# Patient Record
Sex: Female | Born: 1955 | Race: White | Hispanic: No | Marital: Married | Smoking: Former smoker
Health system: Southern US, Community
[De-identification: ages and names within clinical notes are randomized; demographics above are authoritative.]

## PROBLEM LIST (undated history)

## (undated) DIAGNOSIS — F4310 Post-traumatic stress disorder, unspecified: Secondary | ICD-10-CM

## (undated) DIAGNOSIS — F32A Depression, unspecified: Secondary | ICD-10-CM

## (undated) DIAGNOSIS — K219 Gastro-esophageal reflux disease without esophagitis: Secondary | ICD-10-CM

## (undated) DIAGNOSIS — F329 Major depressive disorder, single episode, unspecified: Secondary | ICD-10-CM

## (undated) HISTORY — PX: OTHER SURGICAL HISTORY: SHX169

## (undated) HISTORY — DX: Post-traumatic stress disorder, unspecified: F43.10

---

## 2014-07-18 ENCOUNTER — Encounter: Payer: Self-pay | Admitting: Emergency Medicine

## 2014-07-18 ENCOUNTER — Emergency Department: Payer: PRIVATE HEALTH INSURANCE

## 2014-07-18 ENCOUNTER — Emergency Department
Admission: EM | Admit: 2014-07-18 | Discharge: 2014-07-18 | Disposition: A | Discharge status: Home / Self Care | Payer: PRIVATE HEALTH INSURANCE | Attending: Emergency Medicine | Admitting: Emergency Medicine

## 2014-07-18 DIAGNOSIS — Z87891 Personal history of nicotine dependence: Secondary | ICD-10-CM | POA: Insufficient documentation

## 2014-07-18 DIAGNOSIS — R748 Abnormal levels of other serum enzymes: Secondary | ICD-10-CM

## 2014-07-18 DIAGNOSIS — R7989 Other specified abnormal findings of blood chemistry: Secondary | ICD-10-CM | POA: Diagnosis not present

## 2014-07-18 DIAGNOSIS — B349 Viral infection, unspecified: Secondary | ICD-10-CM | POA: Diagnosis not present

## 2014-07-18 DIAGNOSIS — R509 Fever, unspecified: Secondary | ICD-10-CM | POA: Diagnosis present

## 2014-07-18 LAB — COMPREHENSIVE METABOLIC PANEL
ALBUMIN: 3.1 g/dL — AB (ref 3.5–5.0)
ALT: 260 U/L — ABNORMAL HIGH (ref 14–54)
ANION GAP: 9 (ref 5–15)
AST: 294 U/L — ABNORMAL HIGH (ref 15–41)
Alkaline Phosphatase: 411 U/L — ABNORMAL HIGH (ref 38–126)
BILIRUBIN TOTAL: 0.6 mg/dL (ref 0.3–1.2)
BUN: 9 mg/dL (ref 6–20)
CHLORIDE: 95 mmol/L — AB (ref 101–111)
CO2: 25 mmol/L (ref 22–32)
Calcium: 8.7 mg/dL — ABNORMAL LOW (ref 8.9–10.3)
Creatinine, Ser: 0.73 mg/dL (ref 0.44–1.00)
GFR calc non Af Amer: >60 mL/min (ref >60)
Glucose, Bld: 145 mg/dL — ABNORMAL HIGH (ref 65–99)
POTASSIUM: 3.7 mmol/L (ref 3.5–5.1)
SODIUM: 129 mmol/L — AB (ref 135–145)
Total Protein: 6.2 g/dL — ABNORMAL LOW (ref 6.5–8.1)

## 2014-07-18 LAB — PROTIME-INR
INR: 0.89
Prothrombin Time: 12.3 seconds (ref 11.4–15.0)

## 2014-07-18 LAB — CBC
HCT: 41.5 % (ref 35.0–47.0)
Hemoglobin: 14.1 g/dL (ref 12.0–16.0)
MCH: 31.5 pg (ref 26.0–34.0)
MCHC: 34.1 g/dL (ref 32.0–36.0)
MCV: 92.6 fL (ref 80.0–100.0)
PLATELETS: 56 10*3/uL — AB (ref 150–440)
RBC: 4.48 MIL/uL (ref 3.80–5.20)
RDW: 13.4 % (ref 11.5–14.5)
WBC: 5.6 10*3/uL (ref 3.6–11.0)

## 2014-07-18 LAB — URINALYSIS COMPLETE WITH MICROSCOPIC (ARMC ONLY)
BILIRUBIN URINE: NEGATIVE
Glucose, UA: NEGATIVE mg/dL
HGB URINE DIPSTICK: NEGATIVE
KETONES UR: NEGATIVE mg/dL
Leukocytes, UA: NEGATIVE
NITRITE: NEGATIVE
PROTEIN: 100 mg/dL — AB
SPECIFIC GRAVITY, URINE: 1.019 (ref 1.005–1.030)
pH: 5 (ref 5.0–8.0)

## 2014-07-18 LAB — LIPASE, BLOOD: LIPASE: 37 U/L (ref 22–51)

## 2014-07-18 LAB — TROPONIN I: Troponin I: <0.03 ng/mL (ref <0.031)

## 2014-07-18 MED ORDER — KETOROLAC TROMETHAMINE 30 MG/ML IJ SOLN
INTRAMUSCULAR | Status: AC
  Filled 2014-07-18: qty 1

## 2014-07-18 MED ORDER — SODIUM CHLORIDE 0.9 % IV SOLN
1000.0000 mL | Freq: Once | INTRAVENOUS | Status: AC
  Administered 2014-07-18: 1000 mL via INTRAVENOUS

## 2014-07-18 MED ORDER — METOCLOPRAMIDE HCL 5 MG/ML IJ SOLN
Freq: Once | INTRAMUSCULAR | Status: AC
  Administered 2014-07-18: 13:00:00 via INTRAVENOUS
  Filled 2014-07-18: qty 4

## 2014-07-18 MED ORDER — ONDANSETRON 4 MG PO TBDP
4.0000 mg | ORAL_TABLET | Freq: Once | ORAL | Status: AC
  Administered 2014-07-18: 4 mg via ORAL

## 2014-07-18 MED ORDER — DIPHENHYDRAMINE HCL 50 MG/ML IJ SOLN
INTRAMUSCULAR | Status: AC
  Administered 2014-07-18: 25 mg via INTRAVENOUS
  Filled 2014-07-18: qty 1

## 2014-07-18 MED ORDER — KETOROLAC TROMETHAMINE 30 MG/ML IJ SOLN: 30.0000 mg | Freq: Once | INTRAMUSCULAR | Status: DC

## 2014-07-18 MED ORDER — METOCLOPRAMIDE HCL 5 MG/ML IJ SOLN: 20.0000 mg | Freq: Once | INTRAVENOUS | Status: DC

## 2014-07-18 MED ORDER — DIPHENHYDRAMINE HCL 50 MG/ML IJ SOLN
25.0000 mg | Freq: Once | INTRAMUSCULAR | Status: AC
  Administered 2014-07-18: 25 mg via INTRAVENOUS

## 2014-07-18 MED ORDER — BUTALBITAL-APAP-CAFFEINE 50-325-40 MG PO TABS: 1.0000 | ORAL_TABLET | Freq: Four times a day (QID) | ORAL | Status: AC | PRN

## 2014-07-18 MED ORDER — ONDANSETRON 4 MG PO TBDP
ORAL_TABLET | ORAL | Status: AC
  Administered 2014-07-18: 4 mg via ORAL
  Filled 2014-07-18: qty 1

## 2014-07-18 NOTE — ED Notes (Signed)
This RN called to pharmacy to check on MD verbal order to hang Reglan in NS piggyback rather than dextrose. Pharmacy reports mixture is compatible and mixture will be sent to ED.

## 2014-07-18 NOTE — Discharge Instructions (Signed)

## 2014-07-18 NOTE — ED Provider Notes (Signed)
[facility] Emergency Department Provider Note  ____________________________________________  Time seen: On arrival  I have reviewed the triage vital signs and the nursing notes.   HISTORY  Chief Complaint Fever   HPI Kristi Johnston is a 59 y.o. female who presents with complaints of fever and body aches. She notes fever that developed partially 5 days ago and she has felt achy and has had occasional headaches. She has no neck pain, no altered mental status. She has had mild nausea but no vomiting and no abdominal pain. She does have a mild cough. She was recently bit by a tick in her groin but denies rash. She denies dysuria. No diarrhea     History reviewed. No pertinent past medical history.  There are no active problems to display for this patient.   History reviewed. No pertinent past surgical history.  No current outpatient prescriptions on file.  Allergies Review of patient's allergies indicates no known allergies.  No family history on file.  Social History History  Substance Use Topics  . Smoking status: Former Smoker    Types: Cigarettes  . Smokeless tobacco: Not on file  . Alcohol Use: Yes     Comment: weekends    Review of Systems  Constitutional: Negative for fever. Eyes: Negative for visual changes. ENT: Negative for sore throat Cardiovascular: Negative for chest pain. Respiratory: Negative for shortness of breath. Positive for cough Gastrointestinal: Negative for abdominal pain, vomiting and diarrhea. Positive for mild nausea Genitourinary: Negative for dysuria. Musculoskeletal: Negative for back pain. Skin: Negative for rash. Neurological: Negative for focal weakness Psychiatric: No anxiety  10-point ROS otherwise negative.  ____________________________________________   PHYSICAL EXAM:  VITAL SIGNS: ED Triage Vitals  Enc Vitals Group     BP 07/18/14 0848 113/70 mmHg     Pulse Rate 07/18/14 0846 106     Resp  07/18/14 0846 18     Temp 07/18/14 0846 99.7 F (37.6 C)     Temp Source 07/18/14 0846 Oral     SpO2 07/18/14 0846 98 %     Weight 07/18/14 0846 100 lb (45.36 kg)     Height 07/18/14 0846 4\' 10"  (1.473 m)     Head Cir --      Peak Flow --      Pain Score 07/18/14 0847 8     Pain Loc --      Pain Edu? --      Excl. in GC? --      Constitutional: Alert and oriented. Well appearing and in no distress. Eyes: Conjunctivae are normal.  ENT   Head: Normocephalic and atraumatic.   Mouth/Throat: Mucous membranes are moist. Cardiovascular: Normal rate, regular rhythm. Normal and symmetric distal pulses are present in all extremities. No murmurs, rubs, or gallops. Respiratory: Normal respiratory effort without tachypnea nor retractions. Breath sounds are clear and equal bilaterally.  Gastrointestinal: Very mild right upper quadrant tenderness to palpation. No distention. There is no CVA tenderness. Genitourinary: deferred Musculoskeletal: Nontender with normal range of motion in all extremities. No lower extremity tenderness nor edema. Neurologic:  Normal speech and language. No gross focal neurologic deficits are appreciated. Skin:  Skin is warm, dry and intact. No rash noted. Psychiatric: Mood and affect are normal. Patient exhibits appropriate insight and judgment.  ____________________________________________    LABS (pertinent positives/negatives)  Labs Reviewed  CBC - Abnormal; Notable for the following:    Platelets 56 (*)    All other components within normal limits  COMPREHENSIVE  METABOLIC PANEL - Abnormal; Notable for the following:    Sodium 129 (*)    Chloride 95 (*)    Glucose, Bld 145 (*)    Calcium 8.7 (*)    Total Protein 6.2 (*)    Albumin 3.1 (*)    AST 294 (*)    ALT 260 (*)    Alkaline Phosphatase 411 (*)    All other components within normal limits  TROPONIN I  LIPASE, BLOOD  URINALYSIS COMPLETEWITH MICROSCOPIC ([facility] ONLY)     ____________________________________________   EKG  None  ____________________________________________    RADIOLOGY I have personally reviewed any xrays that were ordered on this patient:  Chest xray unremarkable  [place] RUQ: Normal  ____________________________________________   PROCEDURES  Procedure(s) performed: none  Critical Care performed: none  ____________________________________________   INITIAL IMPRESSION / ASSESSMENT AND PLAN / ED COURSE  Pertinent labs & imaging results that were available during my care of the patient were reviewed by me and considered in my medical decision making (see chart for details).  Patient's initial presentation consistent with viral illness. We will check chest x-ray and urinalysis, blood work and may consider ultrasound of the right upper quadrant given mild tenderness in the area.  ----------------------------------------- 11:35 AM on 07/18/2014 -----------------------------------------  Discussed LFTs with Dr. Marva Panda. He recommends additional EBV panel, CMV serology, anti-smooth muscle, ANA, anti-mitochondrial antibody and discussion with heme/onc  Heme/Onc paged  Discussed with Dr. Orlie Dakin who suspects viral etiology of decreased platelets.  ----------------------------------------- 12:33 PM on 07/18/2014 -----------------------------------------  Multiple labs ordered at the request of Dr. Marva Panda. Patient's workup has been relatively benign except for her LFTs. She does complain of mild headache but given her prolonged time course I'm not concerned about bacterial meningitis furthermore her platelets are too low to allow for lumbar puncture.   ----------------------------------------- 1:20 PM on 07/18/2014 -----------------------------------------  Patient feeling better after Reglan and Benadryl. Offered admission the patient prefers to go home. We have arranged follow-up with Dr. Marva Panda in 2 days and she  agrees to return if any worsening of her symptoms. ____________________________________________   FINAL CLINICAL IMPRESSION(S) / ED DIAGNOSES  Final diagnoses:  Elevated liver enzymes  Viral illness     Jene Every, MD 07/18/14 1320

## 2014-07-18 NOTE — ED Notes (Signed)
Pt states she has been nauseated, "gagging" with little vomiting, chills, fever for past few days, denies burning with urination or blood in urine.

## 2014-07-18 NOTE — ED Notes (Signed)
Pt nauseous upon d/c. Zofran administered and this RN offered to allow pt to rest in bed. Pt reported being ready to leave. Pt wheeled out by RN. No acute distress noted.

## 2014-07-20 ENCOUNTER — Encounter: Payer: Self-pay | Admitting: Emergency Medicine

## 2014-07-20 ENCOUNTER — Emergency Department
Admission: EM | Admit: 2014-07-20 | Discharge: 2014-07-20 | Disposition: A | Discharge status: Home / Self Care | Payer: PRIVATE HEALTH INSURANCE | Attending: Emergency Medicine | Admitting: Emergency Medicine

## 2014-07-20 DIAGNOSIS — Z87891 Personal history of nicotine dependence: Secondary | ICD-10-CM | POA: Diagnosis not present

## 2014-07-20 DIAGNOSIS — Z7982 Long term (current) use of aspirin: Secondary | ICD-10-CM | POA: Insufficient documentation

## 2014-07-20 DIAGNOSIS — K759 Inflammatory liver disease, unspecified: Secondary | ICD-10-CM | POA: Diagnosis not present

## 2014-07-20 DIAGNOSIS — R51 Headache: Secondary | ICD-10-CM | POA: Diagnosis not present

## 2014-07-20 DIAGNOSIS — R112 Nausea with vomiting, unspecified: Secondary | ICD-10-CM | POA: Diagnosis present

## 2014-07-20 DIAGNOSIS — Z79899 Other long term (current) drug therapy: Secondary | ICD-10-CM | POA: Insufficient documentation

## 2014-07-20 DIAGNOSIS — R110 Nausea: Secondary | ICD-10-CM

## 2014-07-20 LAB — CBC WITH DIFFERENTIAL/PLATELET
BASOS ABS: 0.1 10*3/uL (ref 0–0.1)
BASOS PCT: 1 %
EOS PCT: 0 %
Eosinophils Absolute: 0 10*3/uL (ref 0–0.7)
HCT: 44.2 % (ref 35.0–47.0)
HEMOGLOBIN: 15.3 g/dL (ref 12.0–16.0)
LYMPHS ABS: 2.6 10*3/uL (ref 1.0–3.6)
LYMPHS PCT: 30 %
MCH: 31.7 pg (ref 26.0–34.0)
MCHC: 34.7 g/dL (ref 32.0–36.0)
MCV: 91.3 fL (ref 80.0–100.0)
Monocytes Absolute: 0.9 10*3/uL (ref 0.2–0.9)
Monocytes Relative: 11 %
Neutro Abs: 5 10*3/uL (ref 1.4–6.5)
Neutrophils Relative %: 58 %
Platelets: 62 10*3/uL — ABNORMAL LOW (ref 150–440)
RBC: 4.85 MIL/uL (ref 3.80–5.20)
RDW: 13.5 % (ref 11.5–14.5)
Smear Review: DECREASED
WBC: 8.6 10*3/uL (ref 3.6–11.0)

## 2014-07-20 LAB — MITOCHONDRIAL ANTIBODIES: MITOCHONDRIAL M2 AB, IGG: 3.2 U (ref 0.0–20.0)

## 2014-07-20 LAB — URINALYSIS COMPLETE WITH MICROSCOPIC (ARMC ONLY)
BILIRUBIN URINE: NEGATIVE
Glucose, UA: NEGATIVE mg/dL
Hgb urine dipstick: NEGATIVE
Ketones, ur: NEGATIVE mg/dL
LEUKOCYTES UA: NEGATIVE
Nitrite: NEGATIVE
Protein, ur: 30 mg/dL — AB
Specific Gravity, Urine: 1.011 (ref 1.005–1.030)
pH: 6 (ref 5.0–8.0)

## 2014-07-20 LAB — EPSTEIN-BARR VIRUS VCA ANTIBODY PANEL
EBV NA IgG: 133 U/mL — ABNORMAL HIGH (ref 0.0–17.9)
EBV VCA IgG: 101 U/mL — ABNORMAL HIGH (ref 0.0–17.9)
EBV VCA IgM: <36 U/mL (ref 0.0–35.9)

## 2014-07-20 LAB — COMPREHENSIVE METABOLIC PANEL
ALT: 308 U/L — ABNORMAL HIGH (ref 14–54)
ANION GAP: 9 (ref 5–15)
AST: 423 U/L — AB (ref 15–41)
Albumin: 2.9 g/dL — ABNORMAL LOW (ref 3.5–5.0)
Alkaline Phosphatase: 416 U/L — ABNORMAL HIGH (ref 38–126)
BILIRUBIN TOTAL: 0.5 mg/dL (ref 0.3–1.2)
BUN: 13 mg/dL (ref 6–20)
CALCIUM: 8.7 mg/dL — AB (ref 8.9–10.3)
CO2: 25 mmol/L (ref 22–32)
CREATININE: 0.85 mg/dL (ref 0.44–1.00)
Chloride: 98 mmol/L — ABNORMAL LOW (ref 101–111)
GFR calc Af Amer: >60 mL/min (ref >60)
GFR calc non Af Amer: >60 mL/min (ref >60)
Glucose, Bld: 137 mg/dL — ABNORMAL HIGH (ref 65–99)
Potassium: 3.5 mmol/L (ref 3.5–5.1)
Sodium: 132 mmol/L — ABNORMAL LOW (ref 135–145)
TOTAL PROTEIN: 6.2 g/dL — AB (ref 6.5–8.1)

## 2014-07-20 LAB — ANA COMPREHENSIVE PANEL
Anti JO-1: <0.2 AI (ref 0.0–0.9)
Chromatin Ab SerPl-aCnc: <0.2 AI (ref 0.0–0.9)
DS DNA AB: 1 [IU]/mL (ref 0–9)
ENA SM Ab Ser-aCnc: <0.2 AI (ref 0.0–0.9)
SSA (Ro) (ENA) Antibody, IgG: <0.2 AI (ref 0.0–0.9)
Scleroderma (Scl-70) (ENA) Antibody, IgG: <0.2 AI (ref 0.0–0.9)

## 2014-07-20 LAB — ANTI-SMOOTH MUSCLE ANTIBODY, IGG: F-ACTIN AB IGG: 4 U (ref 0–19)

## 2014-07-20 MED ORDER — METOCLOPRAMIDE HCL 5 MG PO TABS: 5.0000 mg | ORAL_TABLET | Freq: Three times a day (TID) | ORAL | Status: DC

## 2014-07-20 MED ORDER — ONDANSETRON HCL 4 MG/2ML IJ SOLN
4.0000 mg | Freq: Once | INTRAMUSCULAR | Status: AC
  Administered 2014-07-20: 4 mg via INTRAVENOUS

## 2014-07-20 MED ORDER — KETOROLAC TROMETHAMINE 30 MG/ML IJ SOLN
30.0000 mg | Freq: Once | INTRAMUSCULAR | Status: AC
  Administered 2014-07-20: 30 mg via INTRAVENOUS

## 2014-07-20 MED ORDER — KETOROLAC TROMETHAMINE 30 MG/ML IJ SOLN
INTRAMUSCULAR | Status: AC
  Administered 2014-07-20: 30 mg via INTRAVENOUS
  Filled 2014-07-20: qty 1

## 2014-07-20 MED ORDER — METOCLOPRAMIDE HCL 5 MG/ML IJ SOLN
10.0000 mg | Freq: Once | INTRAMUSCULAR | Status: AC
  Administered 2014-07-20: 10 mg via INTRAVENOUS

## 2014-07-20 MED ORDER — METOCLOPRAMIDE HCL 5 MG/ML IJ SOLN
INTRAMUSCULAR | Status: AC
  Administered 2014-07-20: 10 mg via INTRAVENOUS
  Filled 2014-07-20: qty 2

## 2014-07-20 MED ORDER — ONDANSETRON HCL 4 MG/2ML IJ SOLN
INTRAMUSCULAR | Status: AC
  Filled 2014-07-20: qty 2

## 2014-07-20 MED ORDER — SODIUM CHLORIDE 0.9 % IV BOLUS (SEPSIS)
1000.0000 mL | Freq: Once | INTRAVENOUS | Status: AC
  Administered 2014-07-20: 1000 mL via INTRAVENOUS

## 2014-07-20 NOTE — ED Notes (Signed)
Pt presents with n/v/d for one week. Pt reports back pain and headache with symptoms.

## 2014-07-20 NOTE — Discharge Instructions (Signed)
It is unclear what is causing your liver issues and the nausea. Follow-up with Dr. Marva PandaSkulskie tomorrow. He felt better after we gave you Reglan here in the emergency department. You're being prescribed additional Reglan to be taken at home as needed. Return to the emergency department if you have higher fever that is not controlled, if you have abdominal pain, or give other urgent concerns.  Nausea, Adult Nausea means you feel sick to your stomach or need to throw up (vomit). It may be a sign of a more serious problem. If nausea gets worse, you may throw up. If you throw up a lot, you may lose too much body fluid (dehydration). HOME CARE   Get plenty of rest.  Ask your doctor how to replace body fluid losses (rehydrate).  Eat small amounts of food. Sip liquids more often.  Take all medicines as told by your doctor. GET HELP RIGHT AWAY IF:  You have a fever.  You pass out (faint).  You keep throwing up or have blood in your throw up.  You are very weak, have dry lips or a dry mouth, or you are very thirsty (dehydrated).  You have dark or bloody poop (stool).  You have very bad chest or belly (abdominal) pain.  You do not get better after 2 days, or you get worse.  You have a headache. MAKE SURE YOU:  Understand these instructions.  Will watch your condition.  Will get help right away if you are not doing well or get worse. Document Released: 12/20/2010 Document Revised: 03/25/2011 Document Reviewed: 12/20/2010 [facility] Patient Information 2015 [place]. This information is not intended to replace advice given to you by your health care provider. Make sure you discuss any questions you have with your health care provider.

## 2014-07-20 NOTE — ED Notes (Addendum)
Pt arrives with complaints of nausea, weakness, and diaherra, pt was seen in ER Monday for same symptoms, pt has appt with GI on Thursday, pt very weak upon arrival, states she is unable to keep anything down, pt has been taking fiorcet at home for headache, pt complains of generalized aches and pains

## 2014-07-20 NOTE — ED Notes (Signed)
Per Dr. Carollee MassedKaminski pt able to take her own prozac pills at bedside

## 2014-07-20 NOTE — ED Provider Notes (Signed)
[facility] Emergency Department Provider Note  ____________________________________________  Time seen:  7:30  I have reviewed the triage vital signs and the nursing notes.   HISTORY  Chief Complaint Emesis and Diarrhea     HPI Kristi Johnston is a 59 y.o. female who reports she is feeling nauseous, having dry heaves, and is having some diarrhea.  The patient was seen in the emergency department 2 days ago. She began having trouble last Thursday. On Wednesday she had eaten chicken sandwich, half of which had stayed in the car during the day. She initially suspected that she may have some form of food poisoning.  She also reports she recently had to use an ice her dog. She reports he had become weaker and sick. She reports that she had gotten some tick bites do due to from the dog.  Her evaluation here the day showed elevated liver enzymes and low platelets. She had a negative ultrasound of the liver and a negative head CT. She was complaining of headache and fever at that time.  Now, the patient reports she is still unable to eat. She eats a few bites of cereal and then starts to feel nauseous. She has been having dry heaves this morning. She denies any focal abdominal pain but says it's uncomfortable at times, especially when she is having the dry heaves.   History reviewed. No pertinent past medical history.  There are no active problems to display for this patient.   History reviewed. No pertinent past surgical history.  Current Outpatient Rx  Name  Route  Sig  Dispense  Refill  . aspirin-acetaminophen-caffeine (EXCEDRIN MIGRAINE) 250-250-65 MG per tablet   Oral   Take 2 tablets by mouth every 6 (six) hours as needed for headache.         . butalbital-acetaminophen-caffeine (FIORICET) 50-325-40 MG per tablet   Oral   Take 1-2 tablets by mouth every 6 (six) hours as needed for headache.   20 tablet   0   . FLUoxetine (PROZAC) 20 MG capsule   Oral   Take 40 mg by mouth daily.         . mirtazapine (REMERON) 7.5 MG tablet   Oral   Take 7.5 mg by mouth at bedtime.         . metoCLOPramide (REGLAN) 5 MG tablet   Oral   Take 1 tablet (5 mg total) by mouth 3 (three) times daily.   15 tablet   0     Allergies Review of patient's allergies indicates no known allergies.  No family history on file.  Social History History  Substance Use Topics  . Smoking status: Former Smoker    Types: Cigarettes  . Smokeless tobacco: Not on file  . Alcohol Use: Yes     Comment: weekends    Review of Systems  Constitutional: Recent fever, prior to her ED visit 2 days ago. ENT: Negative for sore throat. Cardiovascular: Negative for chest pain. Respiratory: Negative for shortness of breath. Gastrointestinal: Positive for nausea with some dry heaves. See history of present illness Genitourinary: Negative for dysuria. Musculoskeletal: No myalgias or injuries. Skin: Negative for rash. Neurological: Positive for headache. See history of present illness and H&P from previous ED visit   10-point ROS otherwise negative.  ____________________________________________   PHYSICAL EXAM:  VITAL SIGNS: ED Triage Vitals  Enc Vitals Group     BP 07/20/14 0708 96/62 mmHg     Pulse Rate 07/20/14 0708 82  Resp 07/20/14 0708 16     Temp 07/20/14 0708 98.1 F (36.7 C)     Temp Source 07/20/14 0708 Oral     SpO2 07/20/14 0708 97 %     Weight 07/20/14 0708 100 lb (45.36 kg)     Height 07/20/14 0708 4\' 10"  (1.473 m)     Head Cir --      Peak Flow --      Pain Score 07/20/14 0710 6     Pain Loc --      Pain Edu? --      Excl. in GC? --     Constitutional: Alert and oriented. Appears uncomfortable, speaks with her eyes closed much of the time area no acute distress. ENT   Head: Normocephalic and atraumatic.   Nose: No congestion/rhinnorhea.   Mouth/Throat: Mucous membranes are moist. Cardiovascular: Normal rate,  regular rhythm, no murmur noted Respiratory:  Normal respiratory effort, no tachypnea.    Breath sounds are clear and equal bilaterally.  Gastrointestinal: Soft and nontender. No distention.  Back: No muscle spasm, no tenderness, no CVA tenderness. Musculoskeletal: No deformity noted. Nontender with normal range of motion in all extremities.  No noted edema. Neurologic:  Normal speech and language. No gross focal neurologic deficits are appreciated.  Skin:  Skin is warm, dry. No rash noted. Psychiatric: Mood and affect are normal. Speech and behavior are normal.  ____________________________________________    LABS (pertinent positives/negatives)  CBC: White blood cell count is slightly higher than her recent visit but still normal at 8.6. Platelets are essentially the same as her last visit at 62k.  ____________________________________________   INITIAL IMPRESSION / ASSESSMENT AND PLAN / ED COURSE  Pertinent labs & imaging results that were available during my care of the patient were reviewed by me and considered in my medical decision making (see chart for details).  Patient with nausea and recently diagnosed abnormal liver function tests. We will treat her with some Zofran and reassess her liver enzymes and reassess the patient.  ----------------------------------------- 9:29 AM on 07/20/2014 -----------------------------------------  I have reassessed the patient, who complains of ongoing nausea. She generally does not feel well. We will treat her with a dose of Reglan. I have called and spoken with Dr. Marva PandaSkulskie who has asked for us to add on hepatitis A, B, C serology.  I'll reassess the patient after the Reglan to see if she is feeling better and discuss discharge or admission of the hospital.  ----------------------------------------- 11:13 AM on 07/20/2014 -----------------------------------------  Patient feels better now. The Reglan has worked well for her nausea. She  feels a little bit of a chill. While she is afebrile today, I suspect she may still have some degree of a body reaction. We will treat her with some Toradol. If she is feeling well we will discharge her home to follow with Dr. Marva PandaSkulskie tomorrow.  ----------------------------------------- 12:40 PM on 07/20/2014 -----------------------------------------  Patient is feeling better. She does not have any nausea. She'll follow with Dr. Marva PandaSkulskie tomorrow. ____________________________________________   FINAL CLINICAL IMPRESSION(S) / ED DIAGNOSES  Final diagnoses:  Nausea  Hepatitis      Darien Ramusavid W Adriann Thau, MD 07/20/14 1243

## 2014-07-21 ENCOUNTER — Inpatient Hospital Stay
Admission: AD | Admit: 2014-07-21 | Discharge: 2014-07-23 | DRG: 948 | Disposition: A | Discharge status: Home / Self Care | Payer: PRIVATE HEALTH INSURANCE | Source: Ambulatory Visit | Attending: Internal Medicine | Admitting: Internal Medicine

## 2014-07-21 ENCOUNTER — Encounter: Payer: Self-pay | Admitting: Internal Medicine

## 2014-07-21 ENCOUNTER — Observation Stay: Payer: PRIVATE HEALTH INSURANCE

## 2014-07-21 ENCOUNTER — Other Ambulatory Visit: Payer: Self-pay | Admitting: Gastroenterology

## 2014-07-21 DIAGNOSIS — E876 Hypokalemia: Secondary | ICD-10-CM | POA: Diagnosis present

## 2014-07-21 DIAGNOSIS — R945 Abnormal results of liver function studies: Secondary | ICD-10-CM

## 2014-07-21 DIAGNOSIS — R112 Nausea with vomiting, unspecified: Secondary | ICD-10-CM | POA: Diagnosis present

## 2014-07-21 DIAGNOSIS — Z79899 Other long term (current) drug therapy: Secondary | ICD-10-CM

## 2014-07-21 DIAGNOSIS — R197 Diarrhea, unspecified: Secondary | ICD-10-CM | POA: Diagnosis present

## 2014-07-21 DIAGNOSIS — I959 Hypotension, unspecified: Secondary | ICD-10-CM | POA: Diagnosis present

## 2014-07-21 DIAGNOSIS — R7989 Other specified abnormal findings of blood chemistry: Secondary | ICD-10-CM

## 2014-07-21 DIAGNOSIS — K219 Gastro-esophageal reflux disease without esophagitis: Secondary | ICD-10-CM | POA: Diagnosis present

## 2014-07-21 DIAGNOSIS — D696 Thrombocytopenia, unspecified: Secondary | ICD-10-CM | POA: Diagnosis present

## 2014-07-21 DIAGNOSIS — Z87891 Personal history of nicotine dependence: Secondary | ICD-10-CM

## 2014-07-21 DIAGNOSIS — R7401 Elevation of levels of liver transaminase levels: Secondary | ICD-10-CM | POA: Diagnosis present

## 2014-07-21 DIAGNOSIS — Z8711 Personal history of peptic ulcer disease: Secondary | ICD-10-CM

## 2014-07-21 DIAGNOSIS — R509 Fever, unspecified: Secondary | ICD-10-CM | POA: Diagnosis present

## 2014-07-21 DIAGNOSIS — F4310 Post-traumatic stress disorder, unspecified: Secondary | ICD-10-CM | POA: Diagnosis present

## 2014-07-21 DIAGNOSIS — R109 Unspecified abdominal pain: Secondary | ICD-10-CM

## 2014-07-21 DIAGNOSIS — F329 Major depressive disorder, single episode, unspecified: Secondary | ICD-10-CM | POA: Diagnosis present

## 2014-07-21 DIAGNOSIS — R740 Nonspecific elevation of levels of transaminase and lactic acid dehydrogenase [LDH]: Principal | ICD-10-CM | POA: Diagnosis present

## 2014-07-21 HISTORY — DX: Major depressive disorder, single episode, unspecified: F32.9

## 2014-07-21 HISTORY — DX: Gastro-esophageal reflux disease without esophagitis: K21.9

## 2014-07-21 HISTORY — DX: Depression, unspecified: F32.A

## 2014-07-21 LAB — URINALYSIS COMPLETE WITH MICROSCOPIC (ARMC ONLY)
Bacteria, UA: NONE SEEN
Bilirubin Urine: NEGATIVE
Glucose, UA: NEGATIVE mg/dL
Hgb urine dipstick: NEGATIVE
Ketones, ur: NEGATIVE mg/dL
Leukocytes, UA: NEGATIVE
NITRITE: NEGATIVE
PH: 6 (ref 5.0–8.0)
PROTEIN: NEGATIVE mg/dL
RBC / HPF: NONE SEEN RBC/hpf (ref 0–5)
Specific Gravity, Urine: 1.001 — ABNORMAL LOW (ref 1.005–1.030)
Squamous Epithelial / LPF: NONE SEEN

## 2014-07-21 LAB — ROCKY MTN SPOTTED FVR ABS PNL(IGG+IGM)
RMSF IgG: NEGATIVE
RMSF IgM: 0.83 index (ref 0.00–0.89)

## 2014-07-21 LAB — LIPASE, BLOOD: Lipase: 54 U/L — ABNORMAL HIGH (ref 22–51)

## 2014-07-21 LAB — HEPATITIS B SURFACE ANTIGEN: Hepatitis B Surface Ag: NEGATIVE

## 2014-07-21 LAB — C DIFFICILE QUICK SCREEN W PCR REFLEX
C DIFFICILE (CDIFF) TOXIN: NEGATIVE
C DIFFICLE (CDIFF) ANTIGEN: NEGATIVE
C Diff interpretation: NEGATIVE

## 2014-07-21 LAB — B. BURGDORFI ANTIBODIES: B burgdorferi Ab IgG+IgM: <0.91 {ISR} (ref 0.00–0.90)

## 2014-07-21 LAB — SEDIMENTATION RATE: Sed Rate: 8 mm/hr (ref 0–30)

## 2014-07-21 MED ORDER — ASPIRIN-ACETAMINOPHEN-CAFFEINE 250-250-65 MG PO TABS: 2.0000 | ORAL_TABLET | Freq: Four times a day (QID) | ORAL | Status: DC | PRN

## 2014-07-21 MED ORDER — ONDANSETRON HCL 4 MG/2ML IJ SOLN: 4.0000 mg | Freq: Four times a day (QID) | INTRAMUSCULAR | Status: DC | PRN

## 2014-07-21 MED ORDER — BUTALBITAL-APAP-CAFFEINE 50-325-40 MG PO TABS: 1.0000 | ORAL_TABLET | Freq: Four times a day (QID) | ORAL | Status: DC | PRN

## 2014-07-21 MED ORDER — SODIUM CHLORIDE 0.9 % IV SOLN
INTRAVENOUS | Status: DC
  Administered 2014-07-21 – 2014-07-23 (×4): via INTRAVENOUS

## 2014-07-21 MED ORDER — ONDANSETRON HCL 4 MG PO TABS: 4.0000 mg | ORAL_TABLET | Freq: Four times a day (QID) | ORAL | Status: DC | PRN

## 2014-07-21 MED ORDER — MIRTAZAPINE 15 MG PO TABS
7.5000 mg | ORAL_TABLET | Freq: Every day | ORAL | Status: DC
  Administered 2014-07-21 – 2014-07-22 (×2): 7.5 mg via ORAL
  Filled 2014-07-21: qty 0.5
  Filled 2014-07-21: qty 1
  Filled 2014-07-21: qty 0.5

## 2014-07-21 MED ORDER — METOCLOPRAMIDE HCL 5 MG PO TABS
5.0000 mg | ORAL_TABLET | Freq: Three times a day (TID) | ORAL | Status: DC
  Administered 2014-07-21 – 2014-07-23 (×6): 5 mg via ORAL
  Filled 2014-07-21 (×7): qty 1

## 2014-07-21 MED ORDER — SODIUM CHLORIDE 0.9 % IJ SOLN: 3.0000 mL | Freq: Two times a day (BID) | INTRAMUSCULAR | Status: DC

## 2014-07-21 MED ORDER — IOHEXOL 240 MG/ML SOLN
25.0000 mL | INTRAMUSCULAR | Status: AC
  Administered 2014-07-21 (×2): 25 mL via ORAL

## 2014-07-21 MED ORDER — FLUOXETINE HCL 20 MG PO CAPS
40.0000 mg | ORAL_CAPSULE | Freq: Every day | ORAL | Status: DC
  Administered 2014-07-22 – 2014-07-23 (×2): 40 mg via ORAL
  Filled 2014-07-21 (×2): qty 2

## 2014-07-21 MED ORDER — MIRTAZAPINE 15 MG PO TABS: 7.5000 mg | ORAL_TABLET | Freq: Every day | ORAL | Status: DC

## 2014-07-21 MED ORDER — ACETAMINOPHEN 650 MG RE SUPP: 650.0000 mg | Freq: Four times a day (QID) | RECTAL | Status: DC | PRN

## 2014-07-21 MED ORDER — FLUOXETINE HCL 20 MG PO CAPS: 20.0000 mg | ORAL_CAPSULE | Freq: Every day | ORAL | Status: DC

## 2014-07-21 MED ORDER — HEPARIN SODIUM (PORCINE) 5000 UNIT/ML IJ SOLN
5000.0000 [IU] | Freq: Three times a day (TID) | INTRAMUSCULAR | Status: DC
  Administered 2014-07-21: 17:00:00 5000 [IU] via SUBCUTANEOUS
  Filled 2014-07-21: qty 1

## 2014-07-21 MED ORDER — PANTOPRAZOLE SODIUM 40 MG PO TBEC
40.0000 mg | DELAYED_RELEASE_TABLET | Freq: Every day | ORAL | Status: DC
  Administered 2014-07-21 – 2014-07-23 (×3): 40 mg via ORAL
  Filled 2014-07-21 (×3): qty 1

## 2014-07-21 MED ORDER — ACETAMINOPHEN 325 MG PO TABS: 650.0000 mg | ORAL_TABLET | Freq: Four times a day (QID) | ORAL | Status: DC | PRN

## 2014-07-21 MED ORDER — IOHEXOL 300 MG/ML  SOLN
80.0000 mL | Freq: Once | INTRAMUSCULAR | Status: AC | PRN
  Administered 2014-07-21: 80 mL via INTRAVENOUS

## 2014-07-21 NOTE — Plan of Care (Signed)
Problem: Discharge Progression Outcomes Goal: Other Discharge Outcomes/Goals Outcome: Progressing Patient is alert and oriented x 4, pt denies pain, pt reports that she has expereinced nausea without emesis but denies need for zofran. [facility] having [facility]/v x 1 week, last bm was on 7/6 in which patient reports was loose, pt lives at home with her husband, is normally independent, moderate fall risk, bed alarm not included in fall score. Patient has a history of depression that is managed well with po medications. Up with stand by assist to bathroom.

## 2014-07-21 NOTE — H&P (Signed)
[facility] - [place] at [facility]   PATIENT NAME: Kristi Johnston    MR#:  [id number]  DATE OF BIRTH:  1955-11-15  DATE OF ADMISSION:  07/21/2014  PRIMARY CARE PHYSICIAN: No PCP Per Patient   REQUESTING/REFERRING PHYSICIAN: Dr Ricki Rodriguez  CHIEF COMPLAINT:  No chief complaint on file.   HISTORY OF PRESENT ILLNESS:  Kristi Johnston  is a 59 y.o. female with a known history of PTSD and peptic ulcer disease who presents for direct admission from gastroenterology due to transaminitis, fever, weakness and abdominal pain. She actually presented to the emergency room on July 4 and sixth and was referred to gastroenterology from the emergency room. She reports that symptoms started soon after eating a chicken salad sandwich which she had left in the car for several hours. She began to feel queasy and weak soon after eating a sandwich. The next day she woke up with nausea vomiting and diarrhea. She presented to the emergency room on July 4 with these symptoms plus with a temperature of 103. She was discharged to home with symptom management but returned on the sixth with continued symptoms as well as a headache. Today she went to follow-up with gastroenterology and due to minimal improvement in symptoms and persistent transaminitis she has been directly admitted for further evaluation. She describes having 2-3 mucousy bowel movements daily with no hematochezia or melanoma. He has more recently been having dry heaves without vomiting she has constant nausea. She is very shaky and weak has had difficulty getting around the house. She describes joint pain and myalgias. No skin rash or vision change. She has been having frontal headaches and has a new spontaneous bruise over the right eyelid.  Of note she also reports sustaining multiple tick bites about 2 weeks ago when brushing her dog. The dog was sick at that time and past within the week. He denies any skin rash at the site of  the bites.  PAST MEDICAL HISTORY:   Past Medical History  Diagnosis Date  . Depression   . GERD (gastroesophageal reflux disease)     PAST SURGICAL HISTORY:  No past surgical history on file.  SOCIAL HISTORY:   History  Substance Use Topics  . Smoking status: Former Smoker    Types: Cigarettes  . Smokeless tobacco: Not on file  . Alcohol Use: Yes     Comment: weekends    FAMILY HISTORY:  No family history on file.  DRUG ALLERGIES:  No Known Allergies  REVIEW OF SYSTEMS:   Review of Systems  Constitutional: Positive for fever, chills, weight loss, malaise/fatigue and diaphoresis.  HENT: Positive for sore throat. Negative for congestion, hearing loss and tinnitus.   Eyes: Negative for blurred vision and pain.  Respiratory: Positive for shortness of breath. Negative for cough, hemoptysis, sputum production, wheezing and stridor.   Cardiovascular: Negative for chest pain, palpitations, orthopnea and leg swelling.  Gastrointestinal: Positive for nausea, vomiting, abdominal pain and diarrhea. Negative for constipation, blood in stool and melena.  Genitourinary: Negative for dysuria, urgency, frequency, hematuria and flank pain.  Musculoskeletal: Positive for myalgias, back pain, joint pain and neck pain. Negative for falls.  Skin: Negative for itching and rash.  Neurological: Positive for dizziness, tremors, weakness and headaches. Negative for sensory change, speech change, focal weakness, seizures and loss of consciousness.  Endo/Heme/Allergies: Does not bruise/bleed easily.  Psychiatric/Behavioral: Negative for depression and hallucinations. The patient is not nervous/anxious.     MEDICATIONS AT HOME:   Prior  to Admission medications   Medication Sig Start Date End Date Taking? Authorizing Provider  aspirin-acetaminophen-caffeine (EXCEDRIN MIGRAINE) [phone] MG per tablet Take 2 tablets by mouth every 6 (six) hours as needed for headache.   Yes Historical Provider,  MD  butalbital-acetaminophen-caffeine (FIORICET) 50-325-40 MG per tablet Take 1-2 tablets by mouth every 6 (six) hours as needed for headache. 07/18/14 07/18/15 Yes Jene Everyobert Kinner, MD  FLUoxetine (PROZAC) 20 MG capsule Take 40 mg by mouth daily.   Yes Historical Provider, MD  metoCLOPramide (REGLAN) 5 MG tablet Take 1 tablet (5 mg total) by mouth 3 (three) times daily. 07/20/14  Yes Darien Ramusavid W Kaminski, MD  mirtazapine (REMERON) 7.5 MG tablet Take 7.5 mg by mouth at bedtime.   Yes Historical Provider, MD      VITAL SIGNS:  Blood pressure 100/55, pulse 67, temperature 99.8 F (37.7 C), temperature source Oral, resp. rate 20, height 4\' 10"  (1.473 m), weight 46.267 kg (102 lb), SpO2 100 %.  PHYSICAL EXAMINATION:  GENERAL:  59 y.o.-year-old patient lying in the bed , weak and ill appearing  EYES: Pupils equal, round, reactive to light and accommodation. No scleral icterus. Extraocular muscles intact.  HEENT: Head atraumatic, normocephalic. Oropharynx and nasopharynx clear. Mucous membranes are dry, tongue covered in a white coating, she has an erosion on the right side of the tongue NECK:  Supple, no jugular venous distention. No thyroid enlargement, no tenderness.  LUNGS: Normal breath sounds bilaterally, no wheezing, rales,rhonchi or crepitation. No use of accessory muscles of respiration.  CARDIOVASCULAR: S1, S2 normal. No murmurs, rubs, or gallops.  ABDOMEN: Soft, diffusely tender, worse in the right upper quadrant, nondistended. Bowel sounds present. No organomegaly or mass.  EXTREMITIES: No pedal edema, cyanosis, or clubbing.  NEUROLOGIC: Cranial nerves II through XII are intact. Muscle strength 5/5 in all extremities. Sensation intact. Gait not checked. Tremor PSYCHIATRIC: The patient is alert and oriented x 3. Anxious, tangential in her speech SKIN: No obvious rash, lesion, or ulcer. She does have several healing scabs over the feet which she reports are the result of tick bites, some bruising over  the left forearm from IV sites  LABORATORY PANEL:   CBC  Recent Labs Lab 07/20/14 0721  WBC 8.6  HGB 15.3  HCT 44.2  PLT 62*   ------------------------------------------------------------------------------------------------------------------  Chemistries   Recent Labs Lab 07/20/14 0721  NA 132*  K 3.5  CL 98*  CO2 25  GLUCOSE 137*  BUN 13  CREATININE 0.85  CALCIUM 8.7*  AST 423*  ALT 308*  ALKPHOS 416*  BILITOT 0.5   Hepatic function tests from July 7 showing alkaline phosphatase of 435, AST 228, ALT 220, albumen 2.9, protein 5.8 ------------------------------------------------------------------------------------------------------------------  Cardiac Enzymes  Recent Labs Lab 07/18/14 0936  TROPONINI <0.03   ------------------------------------------------------------------------------------------------------------------  RADIOLOGY:  No results found.  EKG:  No orders found for this or any previous visit.  IMPRESSION AND PLAN:   Active Problems:   Transaminitis   Thrombocytopenia   Hypotension   Nausea and vomiting   Fever  Problem #1 constellation of symptoms including transaminitis, nausea and vomiting with fever: Agree with admission for further evaluation of symptoms have been ongoing for 1 week and she is showing signs of hypovolemia with hypotension. She will have CT abdomen pelvis today. She has had workup in the emergency room for autoimmune disorders, EBV and tickborne diseases which has been negative. Will add ehrlichia. Hepatitis panel, C. difficile, stool culture and ova and parasites all pending. Gastroenterology to follow.  Problem #2 smudge cells seen on CBC with differential: Concerning for CLL. We will order flow cytometry. White blood cell count is normal which is reassuring.  Problem #3 thrombocytopenia: Hold off on pharmacological DVT prophylaxis. This may be related to liver disease, but may also be a primarily hematologic  issue.  Problem #4 PTSD and depression: Continue with Prozac and Remeron.  Prophylaxis: SCDs and Protonix  All the records are reviewed and case discussed with ED provider. Management plans discussed with the patient, family and they are in agreement.  CODE STATUS: full  TOTAL TIME TAKING CARE OF THIS PATIENT: 55 minutes.    Elby Showers M.D on 07/21/2014 at 7:17 PM  Between 7am to 6pm - Pager - [phone]  After 6pm go to [email or web address] - password [email or web address] [facility]  [facility]  Office  [phone]  CC: Primary care physician; No PCP Per Patient

## 2014-07-21 NOTE — Progress Notes (Signed)
Notify Dr. Winona LegatoVaickute that pt has arrived to floor via telephone. MD acknowledged.

## 2014-07-21 NOTE — Care Management (Signed)
Notified of pending direct admission from Dr. Marva PandaSkulskie office.  I informed the office staff that the patient has VA benefits as primary insurance, and that the patient needed to be notified that the [place] could deny [place] if [facility] were offered [facility].  Staff stated they would inform the patient prior to admission.

## 2014-07-21 NOTE — Consult Note (Signed)
Please see full GI consult note from outpatient clinic earlier this am, hardcopy in chart.  Christena DeemMartin U Freada Twersky, MD Gastroenterology

## 2014-07-22 DIAGNOSIS — I959 Hypotension, unspecified: Secondary | ICD-10-CM | POA: Diagnosis present

## 2014-07-22 DIAGNOSIS — F329 Major depressive disorder, single episode, unspecified: Secondary | ICD-10-CM

## 2014-07-22 DIAGNOSIS — R509 Fever, unspecified: Secondary | ICD-10-CM

## 2014-07-22 DIAGNOSIS — F4310 Post-traumatic stress disorder, unspecified: Secondary | ICD-10-CM

## 2014-07-22 DIAGNOSIS — E876 Hypokalemia: Secondary | ICD-10-CM | POA: Diagnosis present

## 2014-07-22 DIAGNOSIS — Z8711 Personal history of peptic ulcer disease: Secondary | ICD-10-CM

## 2014-07-22 DIAGNOSIS — N281 Cyst of kidney, acquired: Secondary | ICD-10-CM

## 2014-07-22 DIAGNOSIS — Z79899 Other long term (current) drug therapy: Secondary | ICD-10-CM | POA: Diagnosis not present

## 2014-07-22 DIAGNOSIS — K219 Gastro-esophageal reflux disease without esophagitis: Secondary | ICD-10-CM | POA: Diagnosis present

## 2014-07-22 DIAGNOSIS — R740 Nonspecific elevation of levels of transaminase and lactic acid dehydrogenase [LDH]: Secondary | ICD-10-CM | POA: Diagnosis present

## 2014-07-22 DIAGNOSIS — R5383 Other fatigue: Secondary | ICD-10-CM

## 2014-07-22 DIAGNOSIS — E860 Dehydration: Secondary | ICD-10-CM | POA: Diagnosis present

## 2014-07-22 DIAGNOSIS — Z87891 Personal history of nicotine dependence: Secondary | ICD-10-CM

## 2014-07-22 DIAGNOSIS — D696 Thrombocytopenia, unspecified: Secondary | ICD-10-CM

## 2014-07-22 DIAGNOSIS — R197 Diarrhea, unspecified: Secondary | ICD-10-CM | POA: Diagnosis not present

## 2014-07-22 DIAGNOSIS — M419 Scoliosis, unspecified: Secondary | ICD-10-CM

## 2014-07-22 DIAGNOSIS — R7989 Other specified abnormal findings of blood chemistry: Secondary | ICD-10-CM | POA: Diagnosis not present

## 2014-07-22 DIAGNOSIS — I2510 Atherosclerotic heart disease of native coronary artery without angina pectoris: Secondary | ICD-10-CM

## 2014-07-22 DIAGNOSIS — K769 Liver disease, unspecified: Secondary | ICD-10-CM

## 2014-07-22 DIAGNOSIS — R531 Weakness: Secondary | ICD-10-CM

## 2014-07-22 LAB — CBC WITH DIFFERENTIAL/PLATELET
BAND NEUTROPHILS: 2 % (ref 0–10)
BASOS ABS: 0 10*3/uL (ref 0.0–0.1)
BLASTS: 0 %
Basophils Relative: 0 % (ref 0–1)
Eosinophils Absolute: 0 10*3/uL (ref 0.0–0.7)
Eosinophils Relative: 0 % (ref 0–5)
HEMATOCRIT: 33 % — AB (ref 35.0–47.0)
Hemoglobin: 11.5 g/dL — ABNORMAL LOW (ref 12.0–16.0)
LYMPHS ABS: 4 10*3/uL (ref 0.7–4.0)
Lymphocytes Relative: 46 % (ref 12–46)
MCH: 31.6 pg (ref 26.0–34.0)
MCHC: 34.7 g/dL (ref 32.0–36.0)
MCV: 91 fL (ref 80.0–100.0)
MYELOCYTES: 0 %
Metamyelocytes Relative: 0 %
Monocytes Absolute: 0.3 10*3/uL (ref 0.1–1.0)
Monocytes Relative: 3 % (ref 3–12)
Neutro Abs: 4.4 10*3/uL (ref 1.7–7.7)
Neutrophils Relative %: 49 % (ref 43–77)
Other: 0 %
Platelets: 75 10*3/uL — ABNORMAL LOW (ref 150–440)
Promyelocytes Absolute: 0 %
RBC: 3.63 MIL/uL — ABNORMAL LOW (ref 3.80–5.20)
RDW: 13.6 % (ref 11.5–14.5)
WBC: 8.7 10*3/uL (ref 3.6–11.0)
nRBC: 0 /100 WBC

## 2014-07-22 LAB — COMPREHENSIVE METABOLIC PANEL
ALBUMIN: 2.4 g/dL — AB (ref 3.5–5.0)
ALT: 213 U/L — AB (ref 14–54)
AST: 264 U/L — ABNORMAL HIGH (ref 15–41)
Alkaline Phosphatase: 411 U/L — ABNORMAL HIGH (ref 38–126)
Anion gap: 9 (ref 5–15)
BUN: 6 mg/dL (ref 6–20)
CALCIUM: 7.5 mg/dL — AB (ref 8.9–10.3)
CO2: 26 mmol/L (ref 22–32)
Chloride: 97 mmol/L — ABNORMAL LOW (ref 101–111)
Creatinine, Ser: 0.61 mg/dL (ref 0.44–1.00)
GFR calc Af Amer: >60 mL/min (ref >60)
GFR calc non Af Amer: >60 mL/min (ref >60)
GLUCOSE: 106 mg/dL — AB (ref 65–99)
POTASSIUM: 2.9 mmol/L — AB (ref 3.5–5.1)
SODIUM: 132 mmol/L — AB (ref 135–145)
TOTAL PROTEIN: 5 g/dL — AB (ref 6.5–8.1)
Total Bilirubin: 0.7 mg/dL (ref 0.3–1.2)

## 2014-07-22 LAB — HEPATITIS A ANTIBODY, IGM: HEP A IGM: NEGATIVE

## 2014-07-22 LAB — HEPATITIS B CORE ANTIBODY, TOTAL: Hep B Core Total Ab: NEGATIVE

## 2014-07-22 LAB — HEPATITIS C VRS RNA DETECT BY PCR-QUAL: Hepatitis C Vrs RNA by PCR-Qual: NEGATIVE

## 2014-07-22 LAB — MAGNESIUM: Magnesium: 1.7 mg/dL (ref 1.7–2.4)

## 2014-07-22 LAB — HEPATITIS B CORE ANTIBODY, IGM: Hep B C IgM: NEGATIVE

## 2014-07-22 LAB — HEPATITIS B SURFACE ANTIBODY, QUANTITATIVE: HEPATITIS B-POST: 3.6 m[IU]/mL — AB

## 2014-07-22 LAB — HEPATITIS C ANTIBODY: HCV Ab: 0.1 s/co ratio (ref 0.0–0.9)

## 2014-07-22 MED ORDER — POTASSIUM CHLORIDE CRYS ER 20 MEQ PO TBCR
40.0000 meq | EXTENDED_RELEASE_TABLET | Freq: Once | ORAL | Status: AC
  Administered 2014-07-22: 40 meq via ORAL
  Filled 2014-07-22: qty 2

## 2014-07-22 MED ORDER — POTASSIUM CHLORIDE CRYS ER 20 MEQ PO TBCR
20.0000 meq | EXTENDED_RELEASE_TABLET | Freq: Two times a day (BID) | ORAL | Status: DC
  Administered 2014-07-22 – 2014-07-23 (×3): 20 meq via ORAL
  Filled 2014-07-22 (×3): qty 1

## 2014-07-22 MED ORDER — LOPERAMIDE HCL 2 MG PO CAPS: 2.0000 mg | ORAL_CAPSULE | Freq: Four times a day (QID) | ORAL | Status: DC | PRN

## 2014-07-22 MED ORDER — DOXYCYCLINE HYCLATE 100 MG PO TABS
100.0000 mg | ORAL_TABLET | Freq: Two times a day (BID) | ORAL | Status: DC
  Administered 2014-07-22 – 2014-07-23 (×3): 100 mg via ORAL
  Filled 2014-07-22 (×3): qty 1

## 2014-07-22 NOTE — Consult Note (Signed)
Subjective: Patient seen for abnormal liver enzymes, acute diarrheal illness. She states bit better today. She tolerated some full liquids. Continues to have some intermittent nausea. The abdominal pain is currently mild. He has however had multiple diarrheal stools today. Is no blood in the stool.  Objective: Vital signs in last 24 hours: Temp:  [98.3 F (36.8 C)-99.9 F (37.7 C)] 98.6 F (37 C) (07/08 1242) Pulse Rate:  [67-79] 67 (07/08 1242) Resp:  [18] 18 (07/08 1242) BP: (93-106)/(53-60) 104/53 mmHg (07/08 1242) SpO2:  [98 %-100 %] 100 % (07/08 1242) Blood pressure 104/53, pulse 67, temperature 98.6 F (37 C), temperature source Oral, resp. rate 18, height 4\' 10"  (1.473 m), weight 46.267 kg (102 lb), SpO2 100 %.   Intake/Output from previous day: 07/07 0701 - 07/08 0700 In: 158.8 [I.V.:158.8] Out: 700 [Urine:700]  Intake/Output this shift: Total I/O In: 1115 [P.O.:240; I.V.:875] Out: 604 [Urine:600; Stool:4]   General appearance:  59 year old female no acute distress Resp:  Clear to auscultation Cardio:  Regular rate and rhythm GI:  Soft positive tender to palpation and percussion over the liver. No masses or rebound. bowel sounds are positive Extremities:  No clubbing cyanosis or edema   Lab Results: Results for orders placed or performed during the hospital encounter of 07/21/14 (from the past 24 hour(s))  Sedimentation rate     Status: None   Collection Time: 07/21/14  8:01 PM  Result Value Ref Range   Sed Rate 8 0 - 30 mm/hr  Stool culture     Status: None (Preliminary result)   Collection Time: 07/21/14 10:43 PM  Result Value Ref Range   Specimen Description STOOL    Special Requests NONE    Culture TOO YOUNG TO READ    Report Status PENDING   C difficile quick scan w PCR reflex ([facility] only)     Status: None   Collection Time: 07/21/14 10:43 PM  Result Value Ref Range   C Diff antigen NEGATIVE    C Diff toxin NEGATIVE    C Diff interpretation Negative for  C. difficile   Comprehensive metabolic panel     Status: Abnormal   Collection Time: 07/22/14  4:46 AM  Result Value Ref Range   Sodium 132 (L) 135 - 145 mmol/L   Potassium 2.9 (LL) 3.5 - 5.1 mmol/L   Chloride 97 (L) 101 - 111 mmol/L   CO2 26 22 - 32 mmol/L   Glucose, Bld 106 (H) 65 - 99 mg/dL   BUN 6 6 - 20 mg/dL   Creatinine, Ser 1.610.61 0.44 - 1.00 mg/dL   Calcium 7.5 (L) 8.9 - 10.3 mg/dL   Total Protein 5.0 (L) 6.5 - 8.1 g/dL   Albumin 2.4 (L) 3.5 - 5.0 g/dL   AST 096264 (H) 15 - 41 U/L   ALT 213 (H) 14 - 54 U/L   Alkaline Phosphatase 411 (H) 38 - 126 U/L   Total Bilirubin 0.7 0.3 - 1.2 mg/dL   GFR calc non Af Amer >60 >60 mL/min   GFR calc Af Amer >60 >60 mL/min   Anion gap 9 5 - 15  CBC with Differential/Platelet     Status: Abnormal   Collection Time: 07/22/14  4:46 AM  Result Value Ref Range   WBC 8.7 3.6 - 11.0 K/uL   RBC 3.63 (L) 3.80 - 5.20 MIL/uL   Hemoglobin 11.5 (L) 12.0 - 16.0 g/dL   HCT 04.533.0 (L) 40.935.0 - 81.147.0 %   MCV 91.0 80.0 -  100.0 fL   MCH 31.6 26.0 - 34.0 pg   MCHC 34.7 32.0 - 36.0 g/dL   RDW 57.8 46.9 - 62.9 %   Platelets 75 (L) 150 - 440 K/uL   Neutrophils Relative % 49 43 - 77 %   Lymphocytes Relative 46 12 - 46 %   Monocytes Relative 3 3 - 12 %   Eosinophils Relative 0 0 - 5 %   Basophils Relative 0 0 - 1 %   Band Neutrophils 2 0 - 10 %   Metamyelocytes Relative 0 %   Myelocytes 0 %   Promyelocytes Absolute 0 %   Blasts 0 %   nRBC 0 0 /100 WBC   Other 0 %   Neutro Abs 4.4 1.7 - 7.7 K/uL   Lymphs Abs 4.0 0.7 - 4.0 K/uL   Monocytes Absolute 0.3 0.1 - 1.0 K/uL   Eosinophils Absolute 0.0 0.0 - 0.7 K/uL   Basophils Absolute 0.0 0.0 - 0.1 K/uL  Magnesium     Status: None   Collection Time: 07/22/14  4:46 AM  Result Value Ref Range   Magnesium 1.7 1.7 - 2.4 mg/dL      Recent Labs  52/84/13 0721 07/22/14 0446  WBC 8.6 8.7  HGB 15.3 11.5*  HCT 44.2 33.0*  PLT 62* 75*   BMET  Recent Labs  07/20/14 0721 07/22/14 0446  NA 132* 132*  K 3.5  2.9*  CL 98* 97*  CO2 25 26  GLUCOSE 137* 106*  BUN 13 6  CREATININE 0.85 0.61  CALCIUM 8.7* 7.5*   LFT  Recent Labs  07/22/14 0446  PROT 5.0*  ALBUMIN 2.4*  AST 264*  ALT 213*  ALKPHOS 411*  BILITOT 0.7   PT/INR No results for input(s): LABPROT, INR in the last 72 hours. Hepatitis Panel  Recent Labs  07/20/14 0937  HEPBSAG Negative  HCVAB 0.1  HEPAIGM Negative  HEPBIGM Negative   C-Diff  Recent Labs  07/21/14 2243  CDIFFTOX NEGATIVE   No results for input(s): CDIFFPCR in the last 72 hours.   Studies/Results: Ct Abdomen Pelvis W Contrast  07/22/2014   CLINICAL DATA:  Nausea and vomiting for 1 week. Patient denies pain. No previous relevant surgery. Initial encounter.  EXAM: CT ABDOMEN AND PELVIS WITH CONTRAST  TECHNIQUE: Multidetector CT imaging of the abdomen and pelvis was performed using the standard protocol following bolus administration of intravenous contrast.  CONTRAST:  80mL OMNIPAQUE IOHEXOL 300 MG/ML  SOLN  COMPARISON:  Ultrasound 07/18/2014.  FINDINGS: Lower chest: Clear lung bases. No significant pleural or pericardial effusion.  Hepatobiliary: Ill-defined low-density lesion is noted inferiorly in the right hepatic lobe, best seen on the reformatted images. This measures approximately 7 mm on axial image number 27. On the reformatted images, there is a suggestion of peripheral discontinuous enhancement, and lesion is not well seen on the delayed images, findings which suggest a hemangioma. No other liver lesions demonstrated. No evidence of gallstones, gallbladder wall thickening or biliary dilatation.  Pancreas: Unremarkable. No pancreatic ductal dilatation or surrounding inflammatory changes.  Spleen: Normal in size without focal abnormality.  Adrenals/Urinary Tract: Both adrenal glands appear normal.7 mm cyst noted anteriorly in the interpolar region of the right kidney. The kidneys otherwise appear normal. There is no evidence of urinary tract calculus or  hydronephrosis. No bladder abnormality seen.  Stomach/Bowel: No evidence of bowel wall thickening, distention or surrounding inflammatory change.The appendix appears normal.  Vascular/Lymphatic: There are no enlarged abdominal or pelvic lymph nodes.  Minimal aortoiliac atherosclerosis.  Reproductive: Retroverted uterus is deviated to the right. No evidence of adnexal mass or pelvic inflammatory process. A small amount of free pelvic fluid is noted.  Other: No evidence of abdominal wall mass or hernia.  Musculoskeletal: No acute or significant osseous findings. Convex right lumbar scoliosis with mild associated spondylosis.  IMPRESSION: 1. No acute findings or explanation for the patient's symptoms. No evidence of bowel obstruction or appendicitis. 2. Small low-density lesion inferiorly in the right hepatic lobe is not completely characterized by this examination, although has features suggestive of a small hemangioma. 3. Small amount of free pelvic fluid   Electronically Signed   By: Carey Bullocks M.D.   On: 07/22/2014 08:09    Scheduled Inpatient Medications:   . doxycycline  100 mg Oral Q12H  . FLUoxetine  40 mg Oral Daily  . metoCLOPramide  5 mg Oral TID  . mirtazapine  7.5 mg Oral QHS  . pantoprazole  40 mg Oral Daily  . potassium chloride  20 mEq Oral BID  . sodium chloride  3 mL Intravenous Q12H    Continuous Inpatient Infusions:   . sodium chloride 75 mL/hr at 07/22/14 1443    PRN Inpatient Medications:  acetaminophen **OR** acetaminophen, butalbital-acetaminophen-caffeine, loperamide, ondansetron **OR** ondansetron (ZOFRAN) IV  Miscellaneous:   Assessment:  1. Acute diarrheal illness nausea, vomiting, abdominal pain, slow improvement. Awaiting culture results . 2. Abnormal liver enzymes. These are quite likely reactive in nature. There is no evidence of biliary obstruction and a normal bilirubin however to use with transaminitis, some improvement.she continues to have percussive  and palpatory tenderness over the liver. Extensive serologic evaluation for acute hepatitis, autoimmune hepatitis, EBV, rickettsial disease related to tics have been negative.  Plan:  1. Continue current. Awaiting stool study results. Daily liver enzymes. Dr. Shelle Iron to round this weekend  Christena Deem MD 07/22/2014, 7:00 PM

## 2014-07-22 NOTE — Progress Notes (Signed)
[facility] [place] at [facility]   PATIENT NAME: Kristi BornDorothy Johnston    MR#:  [id number]  DATE OF BIRTH:  20-Sep-1955  SUBJECTIVE:  CHIEF COMPLAINT:  Diarrhea, abnormal LFT's.   Patient still continues to have diarrhea this morning. LFTs somewhat improved. No abdominal pain, fever, chills. No nausea, vomiting.  REVIEW OF SYSTEMS:    Review of Systems  Constitutional: Negative for fever and chills.  HENT: Negative for congestion and tinnitus.   Eyes: Negative for blurred vision and double vision.  Respiratory: Negative for cough, shortness of breath and wheezing.   Cardiovascular: Negative for chest pain, orthopnea and PND.  Gastrointestinal: Positive for diarrhea. Negative for nausea, vomiting, abdominal pain and blood in stool.  Genitourinary: Negative for dysuria and hematuria.  Neurological: Negative for dizziness, sensory change and focal weakness.  Endo/Heme/Allergies: Does not bruise/bleed easily.  All other systems reviewed and are negative.  Nutrition: Clear liquid Tolerating Diet: Yes Tolerating PT: Ambulatory   DRUG ALLERGIES:  No Known Allergies  VITALS:  Blood pressure 106/60, pulse 79, temperature 99.9 F (37.7 C), temperature source Oral, resp. rate 18, height 4\' 10"  (1.473 m), weight 46.267 kg (102 lb), SpO2 98 %.  PHYSICAL EXAMINATION:   Physical Exam  GENERAL:  59 y.o.-year-old thin patient lying in the bed with no acute distress.  EYES: Pupils equal, round, reactive to light and accommodation. No scleral icterus. Extraocular muscles intact.  HEENT: Head atraumatic, normocephalic. Oropharynx and nasopharynx clear. Dry Oral mucosa NECK:  Supple, no jugular venous distention. No thyroid enlargement, no tenderness.  LUNGS: Normal breath sounds bilaterally, no wheezing, rales, rhonchi. No use of accessory muscles of respiration.  CARDIOVASCULAR: S1, S2 normal. No murmurs, rubs, or gallops.  ABDOMEN: Soft, nontender, nondistended.  Bowel sounds present. No organomegaly or mass.  EXTREMITIES: No cyanosis, clubbing or edema b/l.    NEUROLOGIC: Cranial nerves II through XII are intact. No focal Motor or sensory deficits b/l.   PSYCHIATRIC: The patient is alert and oriented x 3.  SKIN: No obvious rash, lesion, or ulcer.    LABORATORY PANEL:   CBC  Recent Labs Lab 07/22/14 0446  WBC 8.7  HGB 11.5*  HCT 33.0*  PLT 75*   ------------------------------------------------------------------------------------------------------------------  Chemistries   Recent Labs Lab 07/22/14 0446  NA 132*  K 2.9*  CL 97*  CO2 26  GLUCOSE 106*  BUN 6  CREATININE 0.61  CALCIUM 7.5*  AST 264*  ALT 213*  ALKPHOS 411*  BILITOT 0.7   ------------------------------------------------------------------------------------------------------------------  Cardiac Enzymes  Recent Labs Lab 07/18/14 0936  TROPONINI <0.03   ------------------------------------------------------------------------------------------------------------------  RADIOLOGY:  Ct Abdomen Pelvis W Contrast  07/22/2014   CLINICAL DATA:  Nausea and vomiting for 1 week. Patient denies pain. No previous relevant surgery. Initial encounter.  EXAM: CT ABDOMEN AND PELVIS WITH CONTRAST  TECHNIQUE: Multidetector CT imaging of the abdomen and pelvis was performed using the standard protocol following bolus administration of intravenous contrast.  CONTRAST:  80mL OMNIPAQUE IOHEXOL 300 MG/ML  SOLN  COMPARISON:  Ultrasound 07/18/2014.  FINDINGS: Lower chest: Clear lung bases. No significant pleural or pericardial effusion.  Hepatobiliary: Ill-defined low-density lesion is noted inferiorly in the right hepatic lobe, best seen on the reformatted images. This measures approximately 7 mm on axial image number 27. On the reformatted images, there is a suggestion of peripheral discontinuous enhancement, and lesion is not well seen on the delayed images, findings which suggest a  hemangioma. No other liver lesions demonstrated. No evidence of gallstones,  gallbladder wall thickening or biliary dilatation.  Pancreas: Unremarkable. No pancreatic ductal dilatation or surrounding inflammatory changes.  Spleen: Normal in size without focal abnormality.  Adrenals/Urinary Tract: Both adrenal glands appear normal.7 mm cyst noted anteriorly in the interpolar region of the right kidney. The kidneys otherwise appear normal. There is no evidence of urinary tract calculus or hydronephrosis. No bladder abnormality seen.  Stomach/Bowel: No evidence of bowel wall thickening, distention or surrounding inflammatory change.The appendix appears normal.  Vascular/Lymphatic: There are no enlarged abdominal or pelvic lymph nodes. Minimal aortoiliac atherosclerosis.  Reproductive: Retroverted uterus is deviated to the right. No evidence of adnexal mass or pelvic inflammatory process. A small amount of free pelvic fluid is noted.  Other: No evidence of abdominal wall mass or hernia.  Musculoskeletal: No acute or significant osseous findings. Convex right lumbar scoliosis with mild associated spondylosis.  IMPRESSION: 1. No acute findings or explanation for the patient's symptoms. No evidence of bowel obstruction or appendicitis. 2. Small low-density lesion inferiorly in the right hepatic lobe is not completely characterized by this examination, although has features suggestive of a small hemangioma. 3. Small amount of free pelvic fluid   Electronically Signed   By: Carey Bullocks M.D.   On: 07/22/2014 08:09     ASSESSMENT AND PLAN:   59 year old female with past medical history of PTSD, depression who presented to the hospital due to acute diarrhea and noted to have abnormal liver function tests.  #1 acute diarrhea-the exact etiology of this still unclear. -Stool for C. difficile is negative. Stool for ova and parasites and Giardia was negative on 07/18/2014 CT abdomen pelvis was negative for any acute  pathology. -Continue supportive care with clear liquid diet, IV fluids. Await further GI input. Consider starting the patient on as needed Imodium.  #2 abnormal LFTs-the exact etiology of this is also unclear presently. Hepatitis serology so far are negative. CT scan abdomen pelvis showing no evidence of acute hepatobiliary pathology. -Patient is on Remeron which can raise LFTs although patient has been on it for years. -LFTs are trending down and will continue to monitor. Await further GI input  #3 hypokalemia-this is likely secondary to the ongoing diarrhea. -We'll continue to replace and repeat in the morning. Check magnesium level.  #4 thrombocytopenia-etiology unclear. No evidence of acute bleeding. -Questionable related to chronic liver disease given abnormal LFTs. -Follow platelet count. We'll get hematology oncology consult.  #5 history of PTSD/depression-continue Remeron, fluoxetine    All the records are reviewed and case discussed with Care Management/Social Workerr. Management plans discussed with the patient, family and they are in agreement.  CODE STATUS: Full  DVT Prophylaxis: Teds and SCDs  TOTAL TIME TAKING CARE OF THIS PATIENT: 30 minutes.   POSSIBLE D/C IN 1-2 DAYS, DEPENDING ON CLINICAL CONDITION.   Houston Siren M.D on 07/22/2014 at 10:25 AM  Between 7am to 6pm - Pager - [phone]  After 6pm go to [email or web address] - password [email or web address] [facility]  [facility]  Office  [phone]  CC: Primary care physician; No PCP Per Patient

## 2014-07-22 NOTE — Plan of Care (Signed)
Problem: Discharge Progression Outcomes Goal: Other Discharge Outcomes/Goals Outcome: Progressing Pt is alert and oriented x 4, husband at bedside, up to bsc independently, multiple loose stools throughout shift, hypokalemia treated with potassium supplement, liver enzymes remain elevated but improving, IV fluids infusing, appetite advanced to full liquid, oncology consult placed due to thrombocytopenia. Denies pain, no n/v, contact isolation removed due to c. Diff negative, stool sent for ova and parasite sample, afebrile throughout shift, started on doxycycline po, vital signs stable.

## 2014-07-22 NOTE — Consult Note (Signed)
[facility]  [phone]) [phone]  [phone]     Kristi Johnston DOB: 1955-07-20  MR#: [id number]  XNA#:[id number]  Patient Care Team: No Pcp Per Patient as PCP - General (General Practice) Pcp Not In System as PCP - Family Medicine  CHIEF COMPLAINT:  Patient was admitted through the ER with complaint of fever, chills, diarrhea, weakness and fatigue.  INTERVAL HISTORY:  Patient is a 59 year old female with a significant medical history for PTSD, depression, peptic ulcer disease. She was seen most recently in the emergency room on July 4 with complaints of weakness, abdominal pain, fever, nausea, diarrhea. She was released from the ER with referral to gastroenterology. After follow-up appointment with no great improvement in symptoms she was admitted into the hospital on 07/20/2014 with elevated LFTs as well as diarrhea, weakness, fever, chills, and nausea. Patient reports today having and unable to eat since Wednesday of last week but is now tolerating clear liquids. During her ER visit on July 4, Dr. Corky Downs spoke directly with the on-call hematologist regarding her thrombocytopenia. Dr. Grayland Ormond felt most likely related to viral element, possibly also related to abnormal liver functions and also suggested GI referral at that time. Patient also has history of recent tick bites. Thus far in her workup all labs have been reported as negative. Hematology services have now been on consulted for thrombocytopenia.  REVIEW OF SYSTEMS:   Review of Systems  Constitutional: Positive for malaise/fatigue. Negative for fever, chills, weight loss and diaphoresis.       Diarrhea, fever, chills have improved greatly  HENT: Negative for congestion, ear discharge, ear pain, hearing loss, nosebleeds, sore throat and tinnitus.   Eyes: Negative for blurred vision, double vision, photophobia, pain, discharge and redness.  Respiratory: Positive for cough. Negative for hemoptysis, sputum  production, shortness of breath, wheezing and stridor.   Cardiovascular: Negative for chest pain, palpitations, orthopnea, claudication, leg swelling and PND.  Gastrointestinal: Negative for heartburn, nausea, vomiting, abdominal pain, diarrhea, constipation, blood in stool and melena.  Genitourinary: Negative.   Musculoskeletal: Negative.   Skin: Negative.   Neurological: Positive for weakness. Negative for dizziness, tingling, focal weakness, seizures and headaches.  Endo/Heme/Allergies: Does not bruise/bleed easily.  Psychiatric/Behavioral: Negative for depression. The patient is not nervous/anxious and does not have insomnia.     As per HPI. Otherwise, a complete review of systems is negatve.  ONCOLOGY HISTORY:  No history exists.    PAST MEDICAL HISTORY: Past Medical History  Diagnosis Date  . Depression   . GERD (gastroesophageal reflux disease)     PAST SURGICAL HISTORY: History reviewed. No pertinent past surgical history.  FAMILY HISTORY History reviewed. No pertinent family history.  GYNECOLOGIC HISTORY:  No LMP recorded. Patient is postmenopausal.     ADVANCED DIRECTIVES:    HEALTH MAINTENANCE: History  Substance Use Topics  . Smoking status: Former Smoker    Types: Cigarettes  . Smokeless tobacco: Not on file  . Alcohol Use: Yes     Comment: weekends     Colonoscopy:  PAP:  Bone density:  Lipid panel:  No Known Allergies  Current Facility-Administered Medications  Medication Dose Route Frequency Provider Last Rate Last Dose  . 0.9 %  sodium chloride infusion   Intravenous Continuous Lollie Sails, MD 75 mL/hr at 07/22/14 1443    . acetaminophen (TYLENOL) tablet 650 mg  650 mg Oral Q6H PRN Aldean Jewett, MD       Or  . acetaminophen (TYLENOL) suppository 650 mg  650 mg Rectal Q6H PRN Aldean Jewett, MD      . butalbital-acetaminophen-caffeine (FIORICET, ESGIC) [phone] MG per tablet 1-2 tablet  1-2 tablet Oral Q6H PRN Aldean Jewett, MD      . doxycycline (VIBRA-TABS) tablet 100 mg  100 mg Oral Q12H Henreitta Leber, MD   100 mg at 07/22/14 1547  . FLUoxetine (PROZAC) capsule 40 mg  40 mg Oral Daily Aldean Jewett, MD   40 mg at 07/22/14 0850  . loperamide (IMODIUM) capsule 2 mg  2 mg Oral Q6H PRN Henreitta Leber, MD      . metoCLOPramide (REGLAN) tablet 5 mg  5 mg Oral TID Aldean Jewett, MD   5 mg at 07/22/14 1441  . mirtazapine (REMERON) tablet 7.5 mg  7.5 mg Oral QHS Aldean Jewett, MD   7.5 mg at 07/21/14 2223  . ondansetron (ZOFRAN) tablet 4 mg  4 mg Oral Q6H PRN Aldean Jewett, MD       Or  . ondansetron [facility]) injection 4 mg  4 mg Intravenous Q6H PRN Aldean Jewett, MD      . pantoprazole (PROTONIX) EC tablet 40 mg  40 mg Oral Daily Aldean Jewett, MD   40 mg at 07/22/14 0850  . potassium chloride SA (K-DUR,KLOR-CON) CR tablet 20 mEq  20 mEq Oral BID Henreitta Leber, MD   20 mEq at 07/22/14 1057  . sodium chloride 0.9 % injection 3 mL  3 mL Intravenous Q12H Aldean Jewett, MD   3 mL at 07/21/14 1500    OBJECTIVE: BP 104/53 mmHg  Pulse 67  Temp(Src) 98.6 F (37 C) (Oral)  Resp 18  Ht _0  (1.473 m)  Wt 102 lb (46.267 kg)  BMI 21.32 kg/m2  SpO2 100%   Body mass index is 21.32 kg/(m^2).    ECOG FS:2 - Symptomatic, <50% confined to bed  General: Well-developed, well-nourished, no acute distress. Eyes: Pink conjunctiva, anicteric sclera. HEENT: Normocephalic, moist mucous membranes, clear oropharnyx. Lungs: Clear to auscultation bilaterally. Heart: Regular rate and rhythm. No rubs, murmurs, or gallops. Abdomen: Soft, nontender, nondistended. No organomegaly noted, normoactive bowel sounds. Musculoskeletal: No edema, cyanosis, or clubbing. Neuro: Alert, answering all questions appropriately. Cranial nerves grossly intact. Skin: No rashes or petechiae noted. Psych: Normal affect. Lymphatics: No cervical, calvicular, axillary or inguinal LAD.   LAB RESULTS:  Admission on  07/21/2014  Component Date Value Ref Range Status  . Specimen Description 07/21/2014 BLOOD RESISTANT ASSIST CONTROL   Final  . Special Requests 07/21/2014 NONE   Final  . Culture 07/21/2014 NO GROWTH < 24 HOURS   Final  . Report Status 07/21/2014 PENDING   Incomplete  . Specimen Description 07/21/2014 BLOOD RESISTANT HEEL OF FOOT   Final  . Special Requests 07/21/2014 NONE   Final  . Culture 07/21/2014 NO GROWTH < 24 HOURS   Final  . Report Status 07/21/2014 PENDING   Incomplete  . Color, Urine 07/21/2014 COLORLESS* YELLOW Final  . APPearance 07/21/2014 CLEAR* CLEAR Final  . Glucose, UA 07/21/2014 NEGATIVE  NEGATIVE mg/dL Final  . Bilirubin Urine 07/21/2014 NEGATIVE  NEGATIVE Final  . Ketones, ur 07/21/2014 NEGATIVE  NEGATIVE mg/dL Final  . Specific Gravity, Urine 07/21/2014 1.001* 1.005 - 1.030 Final  . Hgb urine dipstick 07/21/2014 NEGATIVE  NEGATIVE Final  . pH 07/21/2014 6.0  5.0 - 8.0 Final  . Protein, ur 07/21/2014 NEGATIVE  NEGATIVE mg/dL Final  . Nitrite 07/21/2014 NEGATIVE  NEGATIVE  Final  . Leukocytes, UA 07/21/2014 NEGATIVE  NEGATIVE Final  . RBC / HPF 07/21/2014 NONE SEEN  0 - 5 RBC/hpf Final  . WBC, UA 07/21/2014 0-5  0 - 5 WBC/hpf Final  . Bacteria, UA 07/21/2014 NONE SEEN  NONE SEEN Final  . Squamous Epithelial / LPF 07/21/2014 NONE SEEN  NONE SEEN Final  . Mucous 07/21/2014 PRESENT   Final  . Specimen Description 07/21/2014 STOOL   Final  . Special Requests 07/21/2014 NONE   Final  . Culture 07/21/2014 TOO YOUNG TO READ   Final  . Report Status 07/21/2014 PENDING   Incomplete  . C Diff antigen 07/21/2014 NEGATIVE   Final  . C Diff toxin 07/21/2014 NEGATIVE   Final  . C Diff interpretation 07/21/2014 Negative for C. difficile   Final  . Lipase 07/21/2014 54* 22 - 51 U/L Final  . Sodium 07/22/2014 132* 135 - 145 mmol/L Final  . Potassium 07/22/2014 2.9* 3.5 - 5.1 mmol/L Final   Comment: RESULTS VERIFIED BY REPEAT TESTING CRITICAL RESULT CALLED TO, READ BACK BY AND  VERIFIED WITH PHYLLIS KING @ [phone] 07/22/14 BGB   . Chloride 07/22/2014 97* 101 - 111 mmol/L Final  . CO2 07/22/2014 26  22 - 32 mmol/L Final  . Glucose, Bld 07/22/2014 106* 65 - 99 mg/dL Final  . BUN 07/22/2014 6  6 - 20 mg/dL Final  . Creatinine, Ser 07/22/2014 0.61  0.44 - 1.00 mg/dL Final  . Calcium 07/22/2014 7.5* 8.9 - 10.3 mg/dL Final  . Total Protein 07/22/2014 5.0* 6.5 - 8.1 g/dL Final  . Albumin 07/22/2014 2.4* 3.5 - 5.0 g/dL Final  . AST 07/22/2014 264* 15 - 41 U/L Final  . ALT 07/22/2014 213* 14 - 54 U/L Final  . Alkaline Phosphatase 07/22/2014 411* 38 - 126 U/L Final  . Total Bilirubin 07/22/2014 0.7  0.3 - 1.2 mg/dL Final  . GFR calc non Af Amer 07/22/2014 >60  >60 mL/min Final  . GFR calc Af Amer 07/22/2014 >60  >60 mL/min Final   Comment: (NOTE) The eGFR has been calculated using the CKD EPI equation. This calculation has not been validated in all clinical situations. eGFR's persistently <60 mL/min signify possible Chronic Kidney Disease.   . Anion gap 07/22/2014 9  5 - 15 Final  . WBC 07/22/2014 8.7  3.6 - 11.0 K/uL Final  . RBC 07/22/2014 3.63* 3.80 - 5.20 MIL/uL Final  . Hemoglobin 07/22/2014 11.5* 12.0 - 16.0 g/dL Final  . HCT 07/22/2014 33.0* 35.0 - 47.0 % Final  . MCV 07/22/2014 91.0  80.0 - 100.0 fL Final  . MCH 07/22/2014 31.6  26.0 - 34.0 pg Final  . MCHC 07/22/2014 34.7  32.0 - 36.0 g/dL Final  . RDW 07/22/2014 13.6  11.5 - 14.5 % Final  . Platelets 07/22/2014 75* 150 - 440 K/uL Final  . Neutrophils Relative % 07/22/2014 49  43 - 77 % Final  . Lymphocytes Relative 07/22/2014 46  12 - 46 % Final  . Monocytes Relative 07/22/2014 3  3 - 12 % Final  . Eosinophils Relative 07/22/2014 0  0 - 5 % Final  . Basophils Relative 07/22/2014 0  0 - 1 % Final  . Band Neutrophils 07/22/2014 2  0 - 10 % Final  . Metamyelocytes Relative 07/22/2014 0   Final  . Myelocytes 07/22/2014 0   Final  . Promyelocytes Absolute 07/22/2014 0   Final  . Blasts 07/22/2014 0   Final    .  nRBC 07/22/2014 0  0 /100 WBC Final  . Other 07/22/2014 0   Final  . Neutro Abs 07/22/2014 4.4  1.7 - 7.7 K/uL Final  . Lymphs Abs 07/22/2014 4.0  0.7 - 4.0 K/uL Final  . Monocytes Absolute 07/22/2014 0.3  0.1 - 1.0 K/uL Final  . Eosinophils Absolute 07/22/2014 0.0  0.0 - 0.7 K/uL Final  . Basophils Absolute 07/22/2014 0.0  0.0 - 0.1 K/uL Final  . Sed Rate 07/21/2014 8  0 - 30 mm/hr Final  . Magnesium 07/22/2014 1.7  1.7 - 2.4 mg/dL Final  Admission on 07/20/2014, Discharged on 07/20/2014  Component Date Value Ref Range Status  . WBC 07/20/2014 8.6  3.6 - 11.0 K/uL Final  . RBC 07/20/2014 4.85  3.80 - 5.20 MIL/uL Final  . Hemoglobin 07/20/2014 15.3  12.0 - 16.0 g/dL Final  . HCT 07/20/2014 44.2  35.0 - 47.0 % Final  . MCV 07/20/2014 91.3  80.0 - 100.0 fL Final  . MCH 07/20/2014 31.7  26.0 - 34.0 pg Final  . MCHC 07/20/2014 34.7  32.0 - 36.0 g/dL Final  . RDW 07/20/2014 13.5  11.5 - 14.5 % Final  . Platelets 07/20/2014 62* 150 - 440 K/uL Final  . Neutrophils Relative % 07/20/2014 58   Final  . Lymphocytes Relative 07/20/2014 30   Final  . Monocytes Relative 07/20/2014 11   Final  . Eosinophils Relative 07/20/2014 0   Final  . Basophils Relative 07/20/2014 1   Final  . Neutro Abs 07/20/2014 5.0  1.4 - 6.5 K/uL Final  . Lymphs Abs 07/20/2014 2.6  1.0 - 3.6 K/uL Final  . Monocytes Absolute 07/20/2014 0.9  0.2 - 0.9 K/uL Final  . Eosinophils Absolute 07/20/2014 0.0  0 - 0.7 K/uL Final  . Basophils Absolute 07/20/2014 0.1  0 - 0.1 K/uL Final  . WBC Morphology 07/20/2014 SMUDGE CELLS   Final  . Smear Review 07/20/2014 PLATELETS APPEAR DECREASED   Final  . Sodium 07/20/2014 132* 135 - 145 mmol/L Final  . Potassium 07/20/2014 3.5  3.5 - 5.1 mmol/L Final  . Chloride 07/20/2014 98* 101 - 111 mmol/L Final  . CO2 07/20/2014 25  22 - 32 mmol/L Final  . Glucose, Bld 07/20/2014 137* 65 - 99 mg/dL Final  . BUN 07/20/2014 13  6 - 20 mg/dL Final  . Creatinine, Ser 07/20/2014 0.85  0.44 - 1.00  mg/dL Final  . Calcium 07/20/2014 8.7* 8.9 - 10.3 mg/dL Final  . Total Protein 07/20/2014 6.2* 6.5 - 8.1 g/dL Final  . Albumin 07/20/2014 2.9* 3.5 - 5.0 g/dL Final  . AST 07/20/2014 423* 15 - 41 U/L Final  . ALT 07/20/2014 308* 14 - 54 U/L Final  . Alkaline Phosphatase 07/20/2014 416* 38 - 126 U/L Final  . Total Bilirubin 07/20/2014 0.5  0.3 - 1.2 mg/dL Final  . GFR calc non Af Amer 07/20/2014 >60  >60 mL/min Final  . GFR calc Af Amer 07/20/2014 >60  >60 mL/min Final   Comment: (NOTE) The eGFR has been calculated using the CKD EPI equation. This calculation has not been validated in all clinical situations. eGFR's persistently <60 mL/min signify possible Chronic Kidney Disease.   . Anion gap 07/20/2014 9  5 - 15 Final  . Color, Urine 07/20/2014 YELLOW* YELLOW Final  . APPearance 07/20/2014 CLEAR* CLEAR Final  . Glucose, UA 07/20/2014 NEGATIVE  NEGATIVE mg/dL Final  . Bilirubin Urine 07/20/2014 NEGATIVE  NEGATIVE Final  . Ketones, ur 07/20/2014 NEGATIVE  NEGATIVE mg/dL Final  . Specific Gravity, Urine 07/20/2014 1.011  1.005 - 1.030 Final  . Hgb urine dipstick 07/20/2014 NEGATIVE  NEGATIVE Final  . pH 07/20/2014 6.0  5.0 - 8.0 Final  . Protein, ur 07/20/2014 30* NEGATIVE mg/dL Final  . Nitrite 07/20/2014 NEGATIVE  NEGATIVE Final  . Leukocytes, UA 07/20/2014 NEGATIVE  NEGATIVE Final  . RBC / HPF 07/20/2014 0-5  0 - 5 RBC/hpf Final  . WBC, UA 07/20/2014 0-5  0 - 5 WBC/hpf Final  . Bacteria, UA 07/20/2014 RARE* NONE SEEN Final  . Squamous Epithelial / LPF 07/20/2014 0-5* NONE SEEN Final  . Hep A IgM 07/20/2014 Negative  Negative Final   Comment: (NOTE) Performed At: [facility] [place], [place] [id number] Lindon Romp MD SP:[id number]   . Hep B C IgM 07/20/2014 Negative  Negative Final   Comment: (NOTE) Performed At: [facility] [place], [place] [id number] Lindon Romp MD [id number]   . Hep B Core Total Ab 07/20/2014  Negative  Negative Final   Comment: (NOTE) Performed At: [facility] [place], [place] [id number] Lindon Romp MD [id number]   . Hepatitis B-Post 07/20/2014 3.6* Immunity>9.9 mIU/mL Final   Comment: (NOTE)  Status of Immunity                     Anti-HBs Level  ------------------                     -------------- Inconsistent with Immunity                   0.0 - 9.9 Consistent with Immunity                          >9.9 Performed At: [facility] [place], [place] [id number] Lindon Romp MD [id number]   . Hepatitis C Vrs RNA by PCR-Qual 07/20/2014 Negative  Negative Final   Comment: (NOTE) Negative: HCV RNA Not Detected Performed At: [facility] [place], [place] [id number] Lindon Romp MD WT:[id number]   . HCV Ab 07/20/2014 0.1  0.0 - 0.9 s/co ratio Final   Comment: (NOTE)                                  Negative:     < 0.8                             Indeterminate: 0.8 - 0.9                                  Positive:     > 0.9 The CDC recommends that a positive HCV antibody result be followed up with a HCV Nucleic Acid Amplification test (179150). Performed At: [facility] [place], [place] [id number] Lindon Romp MD KP:[id number]   . Hepatitis B Surface Ag 07/20/2014 Negative  Negative Final   Comment: (NOTE) Performed At: [facility] [place], [place] [id number] Lindon Romp MD EO:[id number]     STUDIES: Ct Abdomen Pelvis W Contrast  07/22/2014   CLINICAL DATA:  Nausea and vomiting for 1 week. Patient denies pain. No previous relevant  surgery. Initial encounter.  EXAM: CT ABDOMEN AND PELVIS WITH CONTRAST  TECHNIQUE: Multidetector CT imaging of the abdomen and pelvis was performed using the standard protocol following bolus administration of intravenous contrast.  CONTRAST:  36m OMNIPAQUE IOHEXOL 300 MG/ML  SOLN  COMPARISON:   Ultrasound 07/18/2014.  FINDINGS: Lower chest: Clear lung bases. No significant pleural or pericardial effusion.  Hepatobiliary: Ill-defined low-density lesion is noted inferiorly in the right hepatic lobe, best seen on the reformatted images. This measures approximately 7 mm on axial image number 27. On the reformatted images, there is a suggestion of peripheral discontinuous enhancement, and lesion is not well seen on the delayed images, findings which suggest a hemangioma. No other liver lesions demonstrated. No evidence of gallstones, gallbladder wall thickening or biliary dilatation.  Pancreas: Unremarkable. No pancreatic ductal dilatation or surrounding inflammatory changes.  Spleen: Normal in size without focal abnormality.  Adrenals/Urinary Tract: Both adrenal glands appear normal.7 mm cyst noted anteriorly in the interpolar region of the right kidney. The kidneys otherwise appear normal. There is no evidence of urinary tract calculus or hydronephrosis. No bladder abnormality seen.  Stomach/Bowel: No evidence of bowel wall thickening, distention or surrounding inflammatory change.The appendix appears normal.  Vascular/Lymphatic: There are no enlarged abdominal or pelvic lymph nodes. Minimal aortoiliac atherosclerosis.  Reproductive: Retroverted uterus is deviated to the right. No evidence of adnexal mass or pelvic inflammatory process. A small amount of free pelvic fluid is noted.  Other: No evidence of abdominal wall mass or hernia.  Musculoskeletal: No acute or significant osseous findings. Convex right lumbar scoliosis with mild associated spondylosis.  IMPRESSION: 1. No acute findings or explanation for the patient's symptoms. No evidence of bowel obstruction or appendicitis. 2. Small low-density lesion inferiorly in the right hepatic lobe is not completely characterized by this examination, although has features suggestive of a small hemangioma. 3. Small amount of free pelvic fluid   Electronically  Signed   By: WRichardean SaleM.D.   On: 07/22/2014 08:09    ASSESSMENT:  Thrombocytopenia.  PLAN:   1. Thrombocytopenia. Patient's platelets are actually improving from July 4 when they were 56,000. Today, July 8, platelets up to 75,000. Thrombocytopenia is most likely secondary to a viral infection. GI is following, as LFTs have been significantly elevated but are also improving. Ultrasound of abdomen showed no splenomegaly. CT of abdomen and pelvis also without evidence of acute findings, very small low-density lesion noted in the right hepatic lobe, most consistent with hemangioma. Discussed in depth with Dr. FGrayland Ormond recommend that we continue to monitor platelets without intervention. We'll continue to monitor her over the weekend.  Patient expressed understanding and was in agreement with this plan. She also understands that She can call clinic at any time with any questions, concerns, or complaints.   Dr. FGrayland Ormondwas available for consultation and review of plan of care for this patient.  LEvlyn Kanner NP   07/22/2014 5:06 PM

## 2014-07-22 NOTE — Progress Notes (Signed)
Dr. Betti Cruzeddy called about critical K+ of 2.9 on pt. Order obtained for 40 meq potassium po

## 2014-07-22 NOTE — Plan of Care (Signed)
Problem: Discharge Progression Outcomes Goal: Other Discharge Outcomes/Goals Outcome: Progressing Plan of Care Progress to Goals:  Pt continues to have diarrhea. C-Diff neg. Isolation discontinued. Pt with no complaints of pain or discomfort. Pt up independently to bathroom. VSS.

## 2014-07-23 LAB — COMPREHENSIVE METABOLIC PANEL
ALBUMIN: 2.5 g/dL — AB (ref 3.5–5.0)
ALT: 346 U/L — ABNORMAL HIGH (ref 14–54)
ANION GAP: 7 (ref 5–15)
AST: 513 U/L — ABNORMAL HIGH (ref 15–41)
Alkaline Phosphatase: 561 U/L — ABNORMAL HIGH (ref 38–126)
BUN: <5 mg/dL — ABNORMAL LOW (ref 6–20)
CALCIUM: 7.8 mg/dL — AB (ref 8.9–10.3)
CO2: 25 mmol/L (ref 22–32)
Chloride: 103 mmol/L (ref 101–111)
Creatinine, Ser: 0.55 mg/dL (ref 0.44–1.00)
GFR calc non Af Amer: >60 mL/min (ref >60)
GLUCOSE: 110 mg/dL — AB (ref 65–99)
POTASSIUM: 3.6 mmol/L (ref 3.5–5.1)
Sodium: 135 mmol/L (ref 135–145)
TOTAL PROTEIN: 5.4 g/dL — AB (ref 6.5–8.1)
Total Bilirubin: 0.7 mg/dL (ref 0.3–1.2)

## 2014-07-23 LAB — CBC
HCT: 34.4 % — ABNORMAL LOW (ref 35.0–47.0)
Hemoglobin: 12.2 g/dL (ref 12.0–16.0)
MCH: 32.2 pg (ref 26.0–34.0)
MCHC: 35.3 g/dL (ref 32.0–36.0)
MCV: 91.1 fL (ref 80.0–100.0)
PLATELETS: 104 10*3/uL — AB (ref 150–440)
RBC: 3.78 MIL/uL — ABNORMAL LOW (ref 3.80–5.20)
RDW: 13.5 % (ref 11.5–14.5)
WBC: 10.7 10*3/uL (ref 3.6–11.0)

## 2014-07-23 MED ORDER — DOXYCYCLINE HYCLATE 100 MG PO TABS: 100.0000 mg | ORAL_TABLET | Freq: Two times a day (BID) | ORAL | Status: AC

## 2014-07-23 NOTE — Progress Notes (Signed)
MD order received to discharge pt home today; [facility]//gave Rx to pt and follow up appointments; pt verbalized understanding with no further questions voiced at this time; pt's discharge pending arrival of her husband for a ride home - 1445

## 2014-07-23 NOTE — Progress Notes (Signed)
Pt's spouse present for discharge; pt discharged via wheelchair by nursing to the visitor's entrance 

## 2014-07-23 NOTE — Discharge Summary (Signed)
[facility] [place] [facility]   PATIENT NAME: Kristi Johnston    MR#:  [id number]  DATE OF BIRTH:  08/11/1955  DATE OF ADMISSION:  07/21/2014 ADMITTING PHYSICIAN: Katharina Caper, MD  DATE OF DISCHARGE: 07/23/2014  PRIMARY CARE PHYSICIAN: [facility]   ADMISSION DIAGNOSIS:  Nausea Diarrhea Dehydration Hepatitis   DISCHARGE DIAGNOSIS:  Active Problems:   Transaminitis   Thrombocytopenia   Hypotension   Nausea and vomiting   Fever   Hypokalemia   SECONDARY DIAGNOSIS:   Past Medical History  Diagnosis Date  . Depression   . GERD (gastroesophageal reflux disease)     HOSPITAL COURSE:   Patient presented with elevated liver function tests, fever, weakness and abdominal pain and diarrhea. She initially thought it was food poisoning but things did not get better and she gained to the ER for further evaluation. She did not want to be admitted at that time and followed up with Dr. Marva Panda in the office and was sent in for direct admission. Workup so far in the hospital was negative. Patient was put on empiric doxycycline, just in case this was a tickborne illness causing this. I will give 1 week course of doxycycline upon discharge (I told her to avoid sun exposure with this medication). Viral infections can also cause a similar-type picture. Patient had improved physically and felt much better and wanted to go home on 07/23/2014 in the afternoon. I saw the patient in the morning and a reevaluation in the afternoon. She did not want to stay for further blood test monitoring in the a.m. Her liver function tests were fluctuating up and down during the entire hospital course and will need to be followed up as outpatient. Stool studies were negative. Blood cultures were negative. Hepatitis profiles were negative.  Looking back at CBC differential, smudge cells were seen. Smudge cells can be seen with CLL, so I did recommend that the patient follow-up with  hematology as outpatient.  For the patient's thrombocytopenia that has trended upward. And I recommend following up that as outpatient also.  Hypokalemia was replaced during the hospital course also. Patient tolerating regular diet so this should not be an issue upon discharge.    DISCHARGE CONDITIONS:   Satisfactory  CONSULTS OBTAINED:  Gastroenterology  DRUG ALLERGIES:  No Known Allergies  DISCHARGE MEDICATIONS:   Current Discharge Medication List    START taking these medications   Details  doxycycline (VIBRA-TABS) 100 MG tablet Take 1 tablet (100 mg total) by mouth every 12 (twelve) hours. Qty: 12 tablet, Refills: 0      CONTINUE these medications which have NOT CHANGED   Details  butalbital-acetaminophen-caffeine (FIORICET) 50-325-40 MG per tablet Take 1-2 tablets by mouth every 6 (six) hours as needed for headache. Qty: 20 tablet, Refills: 0    FLUoxetine (PROZAC) 20 MG capsule Take 40 mg by mouth daily.    mirtazapine (REMERON) 7.5 MG tablet Take 7.5 mg by mouth at bedtime.      STOP taking these medications     aspirin-acetaminophen-caffeine (EXCEDRIN MIGRAINE) 250-250-65 MG per tablet      metoCLOPramide (REGLAN) 5 MG tablet          DISCHARGE INSTRUCTIONS:   Follow-up with Dr. Marva Panda 1 week to check blood test again.  If you experience worsening of your admission symptoms, develop shortness of breath, life threatening emergency, suicidal or homicidal thoughts you must seek medical attention immediately by calling 911 or calling your MD immediately  if  symptoms less severe.  You Must read complete instructions/literature along with all the possible adverse reactions/side effects for all the Medicines you take and that have been prescribed to you. Take any new Medicines after you have completely understood and accept all the possible adverse reactions/side effects.   Please note  You were cared for by a hospitalist during your hospital stay. If you  have any questions about your discharge medications or the care you received while you were in the hospital after you are discharged, you can call the unit and asked to speak with the hospitalist on call if the hospitalist that took care of you is not available. Once you are discharged, your primary care physician will handle any further medical issues. Please note that NO REFILLS for any discharge medications will be authorized once you are discharged, as it is imperative that you return to your primary care physician (or establish a relationship with a primary care physician if you do not have one) for your aftercare needs so that they can reassess your need for medications and monitor your lab values.    Today   CHIEF COMPLAINT:  No chief complaint on file.   HISTORY OF PRESENT ILLNESS:  Stevan BornDorothy Johnston  is a 59 y.o. female with a known history of depression and acid reflux. He presents with fever,  abdominal pain,  diarrhea and found to have elevated liver function test and low platelet count.   VITAL SIGNS:  Blood pressure 120/66, pulse 65, temperature 98.2 F (36.8 C), temperature source Oral, resp. rate 20, height 4\' 10"  (1.473 m), weight 46.267 kg (102 lb), SpO2 100 %.  I/O:   Intake/Output Summary (Last 24 hours) at 07/23/14 1415 Last data filed at 07/23/14 1200  Gross per 24 hour  Intake   2170 ml  Output    800 ml  Net   1370 ml    PHYSICAL EXAMINATION:  GENERAL:  59 y.o.-year-old patient lying in the bed with no acute distress.  EYES: Pupils equal, round, reactive to light and accommodation. No scleral icterus. Extraocular muscles intact.  HEENT: Head atraumatic, normocephalic. Oropharynx and nasopharynx clear.  NECK:  Supple, no jugular venous distention. No thyroid enlargement, no tenderness.  LUNGS: Normal breath sounds bilaterally, no wheezing, rales,rhonchi or crepitation. No use of accessory muscles of respiration.  CARDIOVASCULAR: S1, S2 normal. No murmurs, rubs,  or gallops.  ABDOMEN: Soft, non-tender, non-distended. Bowel sounds present. No organomegaly or mass.  EXTREMITIES: No pedal edema, cyanosis, or clubbing.  NEUROLOGIC: Cranial nerves II through XII are intact. Muscle strength 5/5 in all extremities. Sensation intact. Gait not checked.  PSYCHIATRIC: The patient is alert and oriented x 3.  SKIN: No obvious rash, lesion, or ulcer.   DATA REVIEW:   CBC  Recent Labs Lab 07/23/14 0458  WBC 10.7  HGB 12.2  HCT 34.4*  PLT 104*    Chemistries   Recent Labs Lab 07/22/14 0446 07/23/14 0458  NA 132* 135  K 2.9* 3.6  CL 97* 103  CO2 26 25  GLUCOSE 106* 110*  BUN 6 <5*  CREATININE 0.61 0.55  CALCIUM 7.5* 7.8*  MG 1.7  --   AST 264* 513*  ALT 213* 346*  ALKPHOS 411* 561*  BILITOT 0.7 0.7    Cardiac Enzymes  Recent Labs Lab 07/18/14 0936  TROPONINI <0.03    Microbiology Results  Results for orders placed or performed during the hospital encounter of 07/21/14  Culture, blood (routine x 2)  Status: None (Preliminary result)   Collection Time: 07/21/14  3:16 PM  Result Value Ref Range Status   Specimen Description BLOOD RESISTANT ASSIST CONTROL  Final   Special Requests NONE  Final   Culture NO GROWTH 2 DAYS  Final   Report Status PENDING  Incomplete  Culture, blood (routine x 2)     Status: None (Preliminary result)   Collection Time: 07/21/14  3:27 PM  Result Value Ref Range Status   Specimen Description BLOOD RESISTANT HEEL OF FOOT  Final   Special Requests NONE  Final   Culture NO GROWTH 2 DAYS  Final   Report Status PENDING  Incomplete  Stool culture     Status: None (Preliminary result)   Collection Time: 07/21/14 10:43 PM  Result Value Ref Range Status   Specimen Description STOOL  Final   Special Requests NONE  Final   Culture   Final    NO SALMONELLA OR SHIGELLA ISOLATED No Pathogenic E. coli detected NO CAMPYLOBACTER DETECTED    Report Status PENDING  Incomplete  C difficile quick scan w PCR  reflex ([facility] only)     Status: None   Collection Time: 07/21/14 10:43 PM  Result Value Ref Range Status   C Diff antigen NEGATIVE  Final   C Diff toxin NEGATIVE  Final   C Diff interpretation Negative for C. difficile  Final    RADIOLOGY:  Ct Abdomen Pelvis W Contrast  07/22/2014   CLINICAL DATA:  Nausea and vomiting for 1 week. Patient denies pain. No previous relevant surgery. Initial encounter.  EXAM: CT ABDOMEN AND PELVIS WITH CONTRAST  TECHNIQUE: Multidetector CT imaging of the abdomen and pelvis was performed using the standard protocol following bolus administration of intravenous contrast.  CONTRAST:  80mL OMNIPAQUE IOHEXOL 300 MG/ML  SOLN  COMPARISON:  Ultrasound 07/18/2014.  FINDINGS: Lower chest: Clear lung bases. No significant pleural or pericardial effusion.  Hepatobiliary: Ill-defined low-density lesion is noted inferiorly in the right hepatic lobe, best seen on the reformatted images. This measures approximately 7 mm on axial image number 27. On the reformatted images, there is a suggestion of peripheral discontinuous enhancement, and lesion is not well seen on the delayed images, findings which suggest a hemangioma. No other liver lesions demonstrated. No evidence of gallstones, gallbladder wall thickening or biliary dilatation.  Pancreas: Unremarkable. No pancreatic ductal dilatation or surrounding inflammatory changes.  Spleen: Normal in size without focal abnormality.  Adrenals/Urinary Tract: Both adrenal glands appear normal.7 mm cyst noted anteriorly in the interpolar region of the right kidney. The kidneys otherwise appear normal. There is no evidence of urinary tract calculus or hydronephrosis. No bladder abnormality seen.  Stomach/Bowel: No evidence of bowel wall thickening, distention or surrounding inflammatory change.The appendix appears normal.  Vascular/Lymphatic: There are no enlarged abdominal or pelvic lymph nodes. Minimal aortoiliac atherosclerosis.  Reproductive:  Retroverted uterus is deviated to the right. No evidence of adnexal mass or pelvic inflammatory process. A small amount of free pelvic fluid is noted.  Other: No evidence of abdominal wall mass or hernia.  Musculoskeletal: No acute or significant osseous findings. Convex right lumbar scoliosis with mild associated spondylosis.  IMPRESSION: 1. No acute findings or explanation for the patient's symptoms. No evidence of bowel obstruction or appendicitis. 2. Small low-density lesion inferiorly in the right hepatic lobe is not completely characterized by this examination, although has features suggestive of a small hemangioma. 3. Small amount of free pelvic fluid   Electronically Signed   By: Chrissie Noa  Purcell Mouton M.D.   On: 07/22/2014 08:09    Management plans discussed with the patient, family and she is in agreement.  CODE STATUS:     Code Status Orders        Start     Ordered   07/21/14 1455  Full code   Continuous     07/21/14 1455    Advance Directive Documentation        Most Recent Value   Type of Advance Directive  Healthcare Power of Attorney   Pre-existing out of facility DNR order (yellow form or pink MOST form)     "MOST" Form in Place?        TOTAL TIME TAKING CARE OF THIS PATIENT: 45 minutes.    Alford Highland M.D on 07/23/2014 at 2:15 PM  Between 7am to 6pm - Pager - [phone]  After 6pm go to [email or web address] - password [email or web address] [facility]  [facility]  Office  [phone]  CC: Primary care physician; [facility]

## 2014-07-24 LAB — STOOL CULTURE

## 2014-07-26 LAB — CULTURE, BLOOD (ROUTINE X 2)
CULTURE: NO GROWTH
Culture: NO GROWTH

## 2014-07-27 LAB — GIARDIA, EIA; OVA/PARASITE: Giardia Ag, Stl: NEGATIVE

## 2014-07-27 LAB — O&P RESULT

## 2014-08-01 LAB — COMP PANEL: LEUKEMIA/LYMPHOMA

## 2014-08-01 LAB — EHRLICHIA ANTIBODY PANEL
E CHAFFEENSIS AB, IGM: NEGATIVE
E chaffeensis (HGE) Ab, IgG: 1:64 {titer} — ABNORMAL HIGH
E. Chaffeensis (HME) IgM Titer: NEGATIVE

## 2014-08-01 LAB — HEPATITIS PANEL, ACUTE
HCV Ab: 0.1 s/co ratio (ref 0.0–0.9)
HEP B S AG: NEGATIVE
Hep A IgM: NEGATIVE
Hep B C IgM: NEGATIVE

## 2014-08-04 ENCOUNTER — Encounter: Payer: Self-pay | Admitting: Oncology

## 2014-08-04 ENCOUNTER — Inpatient Hospital Stay: Payer: PRIVATE HEALTH INSURANCE | Attending: Oncology | Admitting: Oncology

## 2014-08-04 ENCOUNTER — Inpatient Hospital Stay: Payer: PRIVATE HEALTH INSURANCE

## 2014-08-04 VITALS — BP 131/80 | HR 81 | Temp 97.7°F | Resp 18 | Wt 96.8 lb

## 2014-08-04 DIAGNOSIS — K769 Liver disease, unspecified: Secondary | ICD-10-CM | POA: Diagnosis not present

## 2014-08-04 DIAGNOSIS — K219 Gastro-esophageal reflux disease without esophagitis: Secondary | ICD-10-CM | POA: Diagnosis not present

## 2014-08-04 DIAGNOSIS — D696 Thrombocytopenia, unspecified: Secondary | ICD-10-CM | POA: Diagnosis not present

## 2014-08-04 DIAGNOSIS — Z87891 Personal history of nicotine dependence: Secondary | ICD-10-CM | POA: Insufficient documentation

## 2014-08-04 DIAGNOSIS — R05 Cough: Secondary | ICD-10-CM | POA: Diagnosis not present

## 2014-08-04 DIAGNOSIS — D72829 Elevated white blood cell count, unspecified: Secondary | ICD-10-CM

## 2014-08-04 DIAGNOSIS — R509 Fever, unspecified: Secondary | ICD-10-CM | POA: Insufficient documentation

## 2014-08-04 DIAGNOSIS — Z79899 Other long term (current) drug therapy: Secondary | ICD-10-CM | POA: Insufficient documentation

## 2014-08-04 DIAGNOSIS — B199 Unspecified viral hepatitis without hepatic coma: Secondary | ICD-10-CM | POA: Diagnosis not present

## 2014-08-04 DIAGNOSIS — F329 Major depressive disorder, single episode, unspecified: Secondary | ICD-10-CM | POA: Insufficient documentation

## 2014-08-04 DIAGNOSIS — E860 Dehydration: Secondary | ICD-10-CM | POA: Diagnosis not present

## 2014-08-04 DIAGNOSIS — F4310 Post-traumatic stress disorder, unspecified: Secondary | ICD-10-CM | POA: Insufficient documentation

## 2014-08-04 LAB — CBC WITH DIFFERENTIAL/PLATELET
Basophils Absolute: 0.1 10*3/uL (ref 0–0.1)
Basophils Relative: 1 %
EOS ABS: 0 10*3/uL (ref 0–0.7)
Eosinophils Relative: 0 %
HCT: 38.8 % (ref 35.0–47.0)
Hemoglobin: 12.7 g/dL (ref 12.0–16.0)
LYMPHS PCT: 60 %
Lymphs Abs: 7.5 10*3/uL — ABNORMAL HIGH (ref 1.0–3.6)
MCH: 30 pg (ref 26.0–34.0)
MCHC: 32.6 g/dL (ref 32.0–36.0)
MCV: 91.8 fL (ref 80.0–100.0)
MONO ABS: 1.3 10*3/uL — AB (ref 0.2–0.9)
Monocytes Relative: 10 %
Neutro Abs: 3.6 10*3/uL (ref 1.4–6.5)
Neutrophils Relative %: 29 %
Platelets: 396 10*3/uL (ref 150–440)
RBC: 4.23 MIL/uL (ref 3.80–5.20)
RDW: 13.7 % (ref 11.5–14.5)
WBC: 12.4 10*3/uL — ABNORMAL HIGH (ref 3.6–11.0)

## 2014-08-08 LAB — COMP PANEL: LEUKEMIA/LYMPHOMA

## 2014-08-15 NOTE — Progress Notes (Signed)
[facility]  [phone] [phone]  ID: Kristi Johnston OB: [birth year]  MR#: [id number]  AOZ#:[id number]  Patient Care Team: No Pcp Per Patient as PCP - General (General Practice) Pcp Not In System as PCP - Family Medicine  CHIEF COMPLAINT:  Chief Complaint  Patient presents with  . Hospitalization Follow-up    Thrombocytopenia    INTERVAL HISTORY: Patient is a 59 year old female who was recently discharged from the hospital after having a viral syndrome resulting in hepatitis as well as dehydration. She was also found to be significantly thrombocytopenic. She is now fully recovered and back to her baseline. She has no neurologic complaints. She denies any further fevers. She has a good appetite and denies weight loss. She has no chest pain or shortness of breath. She denies any nausea, vomiting, constipation, or diarrhea. She has no urinary complaints. Patient offers no specific complaints today.  REVIEW OF SYSTEMS:   Review of Systems  Constitutional: Negative.   Respiratory: Negative.   Cardiovascular: Negative.   Gastrointestinal: Negative.   Neurological: Negative.     As per HPI. Otherwise, a complete review of systems is negatve.  PAST MEDICAL HISTORY: Past Medical History  Diagnosis Date  . Depression   . GERD (gastroesophageal reflux disease)   . PTSD (post-traumatic stress disorder)     PAST SURGICAL HISTORY: Past Surgical History  Procedure Laterality Date  . Broken nose      FAMILY HISTORY:  Reviewed and unchanged. No reported history of malignancy or chronic disease.     ADVANCED DIRECTIVES:    HEALTH MAINTENANCE: History  Substance Use Topics  . Smoking status: Former Smoker    Types: Cigarettes  . Smokeless tobacco: Never Used  . Alcohol Use: Yes     Comment: weekends     Colonoscopy:  PAP:  Bone density:  Lipid panel:  No Known Allergies  Current Outpatient Prescriptions  Medication Sig  Dispense Refill  . butalbital-acetaminophen-caffeine (FIORICET) 50-325-40 MG per tablet Take 1-2 tablets by mouth every 6 (six) hours as needed for headache. 20 tablet 0  . doxycycline (VIBRA-TABS) 100 MG tablet Take 1 tablet (100 mg total) by mouth every 12 (twelve) hours. 12 tablet 0  . FLUoxetine (PROZAC) 20 MG capsule Take 40 mg by mouth daily.    . mirtazapine (REMERON) 7.5 MG tablet Take 7.5 mg by mouth at bedtime.     No current facility-administered medications for this visit.    OBJECTIVE: Filed Vitals:   08/04/14 1430  BP: 131/80  Pulse: 81  Temp: 97.7 F (36.5 C)  Resp: 18     Body mass index is 20.23 kg/(m^2).    ECOG FS:0 - Asymptomatic   General: Well-developed, well-nourished, no acute distress. Eyes: Pink conjunctiva, anicteric sclera. HEENT: Normocephalic, moist mucous membranes, clear oropharnyx. Lungs: Clear to auscultation bilaterally. Heart: Regular rate and rhythm. No rubs, murmurs, or gallops. Abdomen: Soft, nontender, nondistended. No organomegaly noted, normoactive bowel sounds. Musculoskeletal: No edema, cyanosis, or clubbing. Neuro: Alert, answering all questions appropriately. Cranial nerves grossly intact. Skin: No rashes or petechiae noted. Psych: Normal affect. Lymphatics: No cervical, calvicular, axillary or inguinal LAD.   LAB RESULTS:  Lab Results  Component Value Date   NA 135 07/23/2014   K 3.6 07/23/2014   CL 103 07/23/2014   CO2 25 07/23/2014   GLUCOSE 110* 07/23/2014   BUN <5* 07/23/2014   CREATININE 0.55 07/23/2014   CALCIUM 7.8* 07/23/2014   PROT 5.4* 07/23/2014   ALBUMIN 2.5* 07/23/2014  AST 513* 07/23/2014   ALT 346* 07/23/2014   ALKPHOS 561* 07/23/2014   BILITOT 0.7 07/23/2014   GFRNONAA >60 07/23/2014   GFRAA >60 07/23/2014    Lab Results  Component Value Date   WBC 12.4* 08/04/2014   NEUTROABS 3.6 08/04/2014   HGB 12.7 08/04/2014   HCT 38.8 08/04/2014   MCV 91.8 08/04/2014   PLT 396 08/04/2014      STUDIES: Dg Chest 2 View  07/18/2014   CLINICAL DATA:  Cough, fever for 4 days.  EXAM: CHEST  2 VIEW  COMPARISON:  None.  FINDINGS: The heart size and mediastinal contours are within normal limits. Both lungs are clear. The visualized skeletal structures are unremarkable.  IMPRESSION: No active cardiopulmonary disease.   Electronically Signed   By: Charlett Nose M.D.   On: 07/18/2014 09:26   Ct Head Wo Contrast  07/18/2014   CLINICAL DATA:  Nausea, gagging, little vomiting, headache, chills, and fever  EXAM: CT HEAD WITHOUT CONTRAST  TECHNIQUE: Contiguous axial images were obtained from the base of the skull through the vertex without intravenous contrast.  COMPARISON:  None  FINDINGS: Normal ventricular morphology.  No midline shift or mass effect.  Normal appearance of brain parenchyma.  No intracranial hemorrhage, mass lesion or evidence acute infarction.  No extra-axial fluid collections.  Paranasal sinuses and mastoid air cells clear.  Bones unremarkable.  IMPRESSION: Normal exam.   Electronically Signed   By: Ulyses Southward M.D.   On: 07/18/2014 12:38   Ct Abdomen Pelvis W Contrast  07/22/2014   CLINICAL DATA:  Nausea and vomiting for 1 week. Patient denies pain. No previous relevant surgery. Initial encounter.  EXAM: CT ABDOMEN AND PELVIS WITH CONTRAST  TECHNIQUE: Multidetector CT imaging of the abdomen and pelvis was performed using the standard protocol following bolus administration of intravenous contrast.  CONTRAST:  80mL OMNIPAQUE IOHEXOL 300 MG/ML  SOLN  COMPARISON:  Ultrasound 07/18/2014.  FINDINGS: Lower chest: Clear lung bases. No significant pleural or pericardial effusion.  Hepatobiliary: Ill-defined low-density lesion is noted inferiorly in the right hepatic lobe, best seen on the reformatted images. This measures approximately 7 mm on axial image number 27. On the reformatted images, there is a suggestion of peripheral discontinuous enhancement, and lesion is not well seen on the  delayed images, findings which suggest a hemangioma. No other liver lesions demonstrated. No evidence of gallstones, gallbladder wall thickening or biliary dilatation.  Pancreas: Unremarkable. No pancreatic ductal dilatation or surrounding inflammatory changes.  Spleen: Normal in size without focal abnormality.  Adrenals/Urinary Tract: Both adrenal glands appear normal.7 mm cyst noted anteriorly in the interpolar region of the right kidney. The kidneys otherwise appear normal. There is no evidence of urinary tract calculus or hydronephrosis. No bladder abnormality seen.  Stomach/Bowel: No evidence of bowel wall thickening, distention or surrounding inflammatory change.The appendix appears normal.  Vascular/Lymphatic: There are no enlarged abdominal or pelvic lymph nodes. Minimal aortoiliac atherosclerosis.  Reproductive: Retroverted uterus is deviated to the right. No evidence of adnexal mass or pelvic inflammatory process. A small amount of free pelvic fluid is noted.  Other: No evidence of abdominal wall mass or hernia.  Musculoskeletal: No acute or significant osseous findings. Convex right lumbar scoliosis with mild associated spondylosis.  IMPRESSION: 1. No acute findings or explanation for the patient's symptoms. No evidence of bowel obstruction or appendicitis. 2. Small low-density lesion inferiorly in the right hepatic lobe is not completely characterized by this examination, although has features suggestive of a small  hemangioma. 3. Small amount of free pelvic fluid   Electronically Signed   By: Carey Bullocks M.D.   On: 07/22/2014 08:09   US Abdomen Limited Ruq  07/18/2014   CLINICAL DATA:  Elevated liver enzymes  EXAM: US ABDOMEN LIMITED - RIGHT UPPER QUADRANT  COMPARISON:  None.  FINDINGS: Gallbladder:  Incompletely distended despite stated NPO status. No stones or wall thickening. No pericholecystic fluid. Sonographer reports no sonographic Murphy's sign.  Common bile duct:  Diameter: 3.3 mm  Liver:   No focal lesion identified. Within normal limits in parenchymal echogenicity.  IMPRESSION: Negative   Electronically Signed   By: Corlis Leak M.D.   On: 07/18/2014 11:03    ASSESSMENT: Thrombocytopenia, resolved. Leukocytosis.  PLAN:    1. Thrombocytopenia: Patient's platelet count has now returned to normal. Her decrease in platelet count is likely secondary to the viral syndrome and hepatitis that she had well and the hospital. No further intervention is needed. Return to clinic in 3 months with repeat laboratory work and further evaluation. If her platelet count continues to remain within normal limits at that time, she likely can be discharged from clinic. 2. Leukocytosis: Likely reactive. Although, patient's flow cytometry report revealed an increased of T gamma delta cells which can be elevated in both infectious and neoplastic processes. No monoclonal B-cell population was detected. No intervention is needed at this time. Repeat CBC as above.  Patient expressed understanding and was in agreement with this plan. She also understands that She can call clinic at any time with any questions, concerns, or complaints.   Jeralyn Ruths, MD   08/15/2014 8:47 AM

## 2014-11-03 ENCOUNTER — Inpatient Hospital Stay: Payer: PRIVATE HEALTH INSURANCE | Admitting: Oncology

## 2014-11-03 ENCOUNTER — Inpatient Hospital Stay: Payer: PRIVATE HEALTH INSURANCE

## 2014-11-22 ENCOUNTER — Other Ambulatory Visit: Payer: PRIVATE HEALTH INSURANCE

## 2014-11-22 ENCOUNTER — Ambulatory Visit: Payer: PRIVATE HEALTH INSURANCE | Admitting: Oncology

## 2016-05-21 IMAGING — CT CT HEAD W/O CM
1 series · 16 of 30 positions shown, 20 images · non-contrast
Comparison: None

CLINICAL DATA: Nausea, gagging, little vomiting, headache, chills,
and fever

EXAM:
CT HEAD WITHOUT CONTRAST
TECHNIQUE: Contiguous axial images were obtained from the base of the skull
through the vertex without intravenous contrast.

[Series 2: head wo · axial · 0.39mm/px · z∈[-169,-25]mm · 16 of 36 slices shown, 20 images]
[im 2/36  brain]
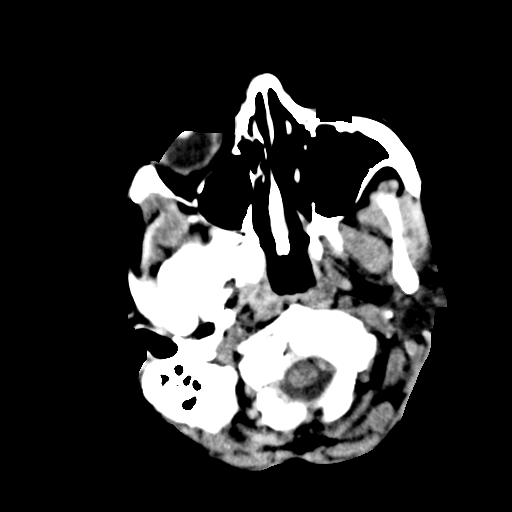
[im 2/36  bone]
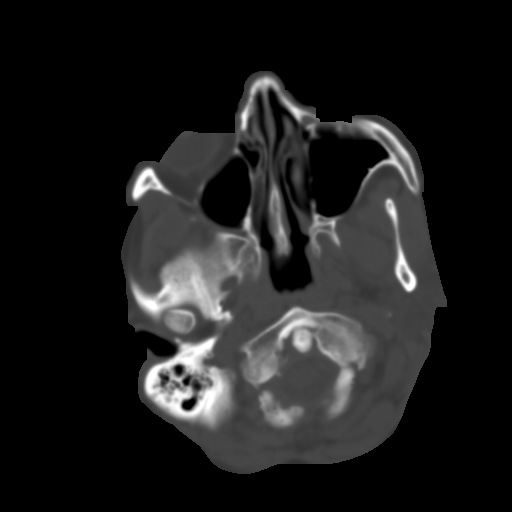
[im 4/36  brain]
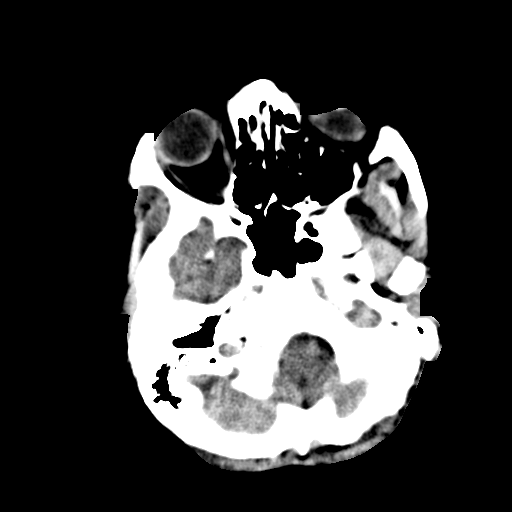
[im 7/36  brain]
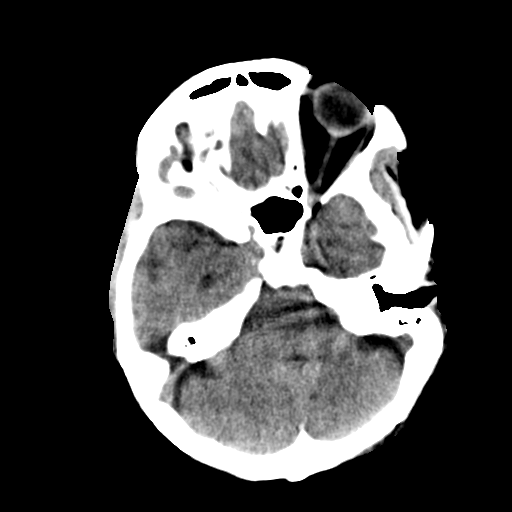
[im 9/36  brain]
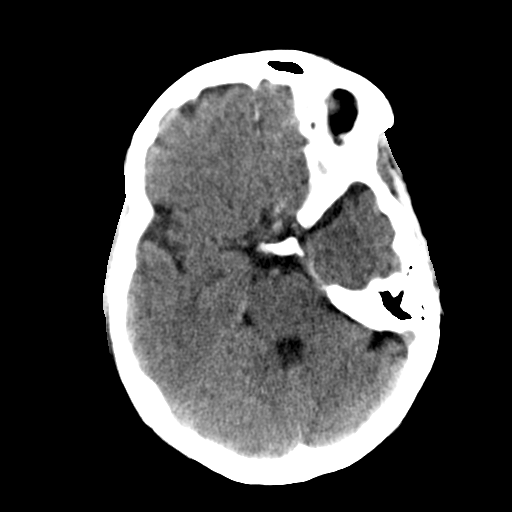
[im 10/36  brain]
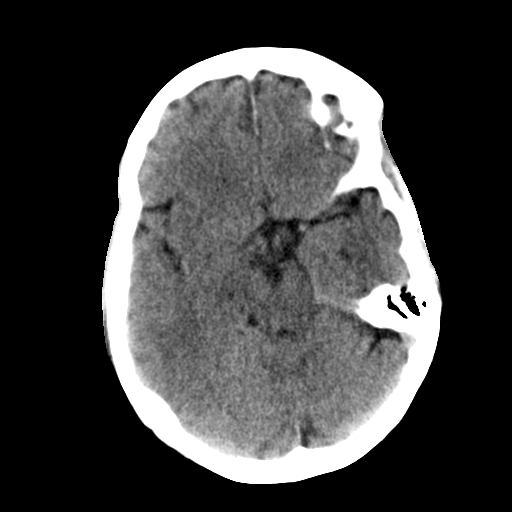
[im 10/36  bone]
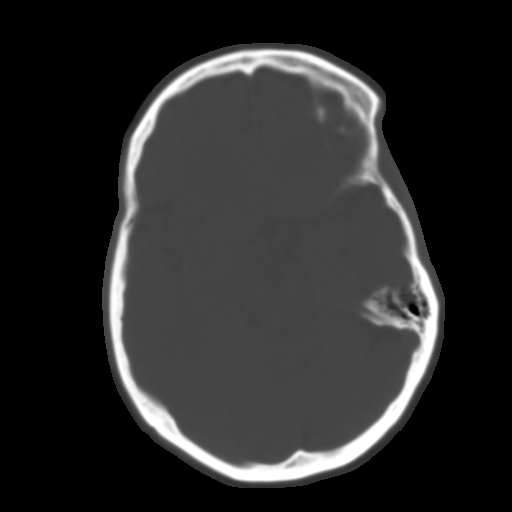
[im 13/36  brain]
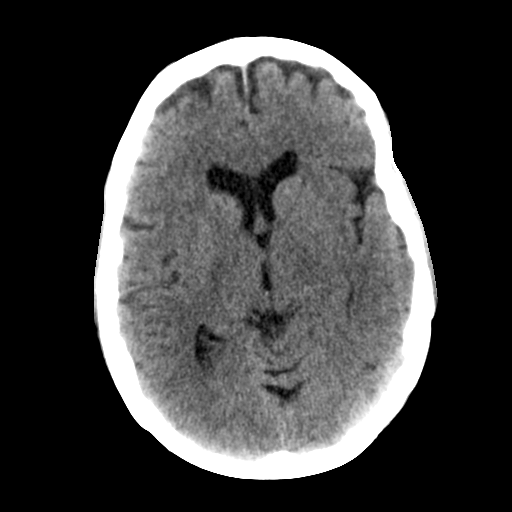
[im 15/36  brain]
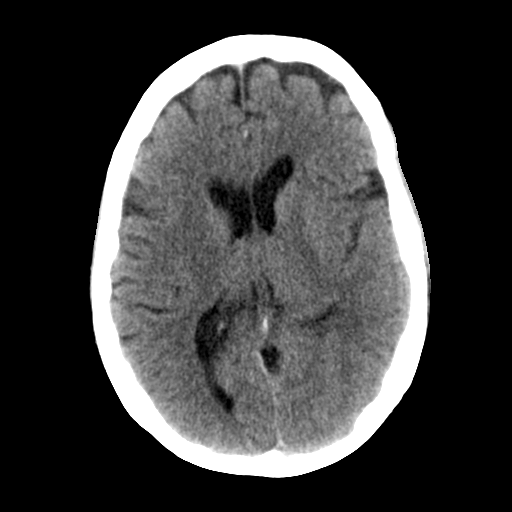
[im 17/36  brain]
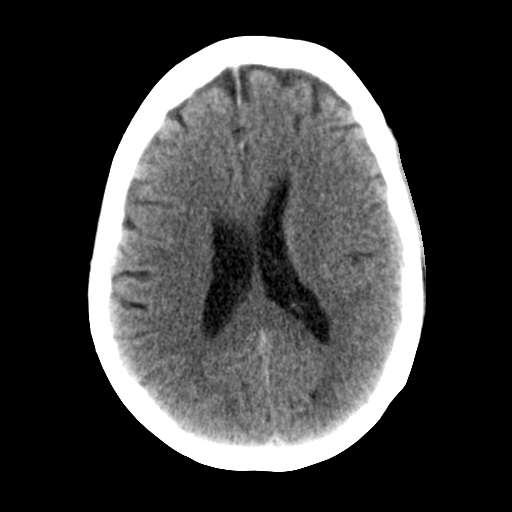
[im 19/36  brain]
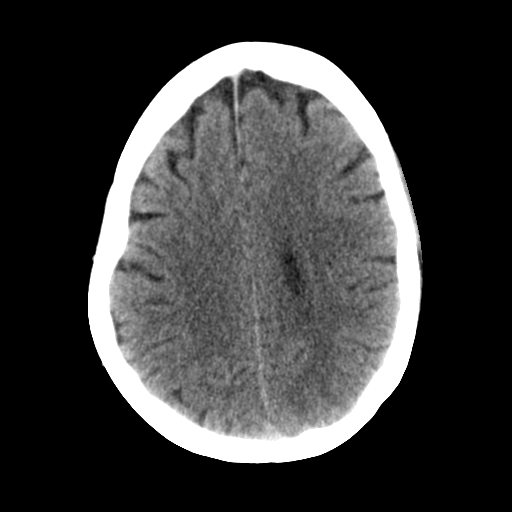
[im 19/36  bone]
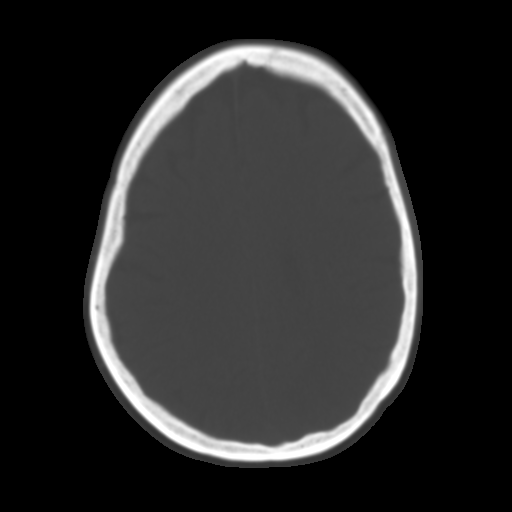
[im 21/36  brain]
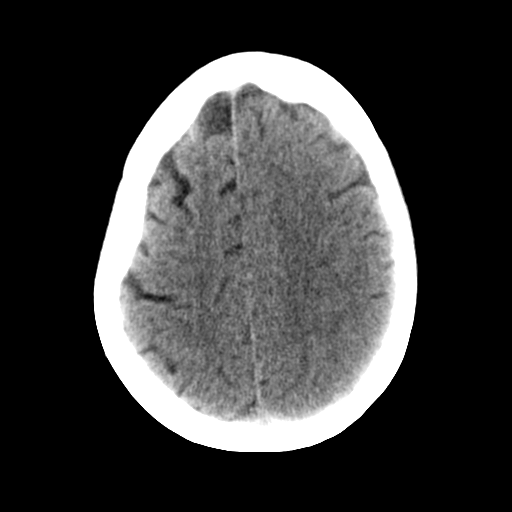
[im 23/36  brain]
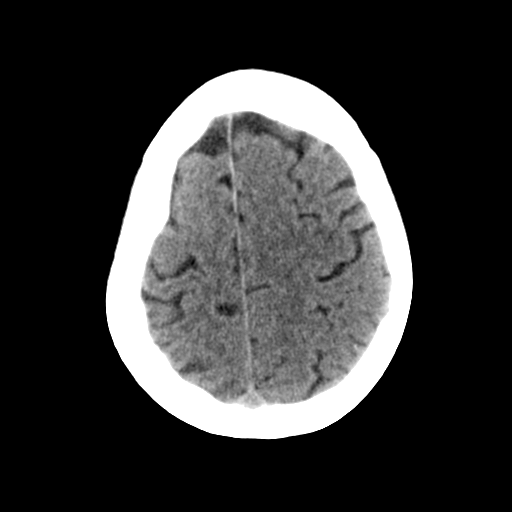
[im 26/36  brain]
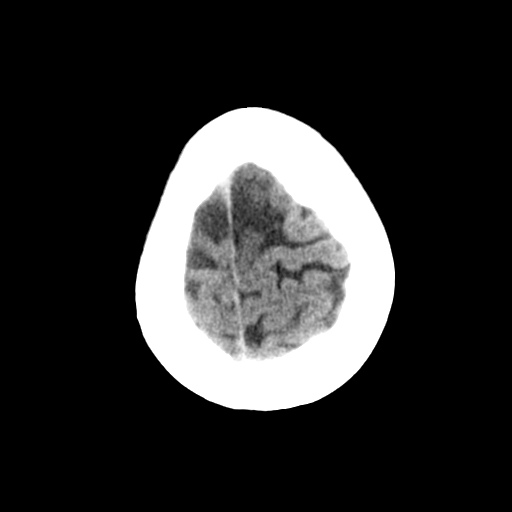
[im 27/36  brain]
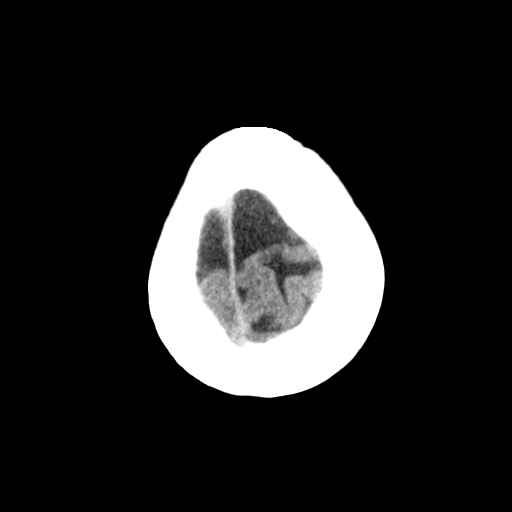
[im 27/36  bone]
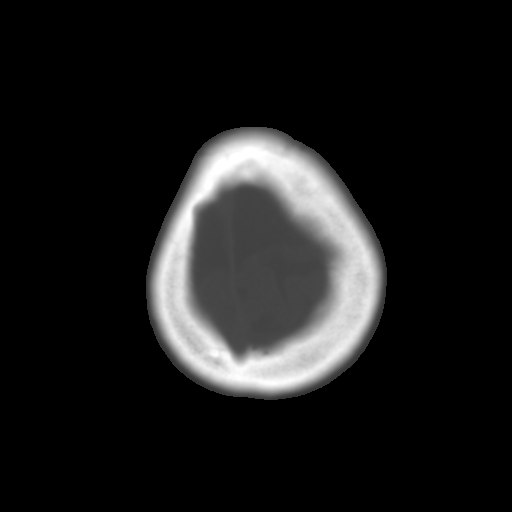
[im 29/36  brain]
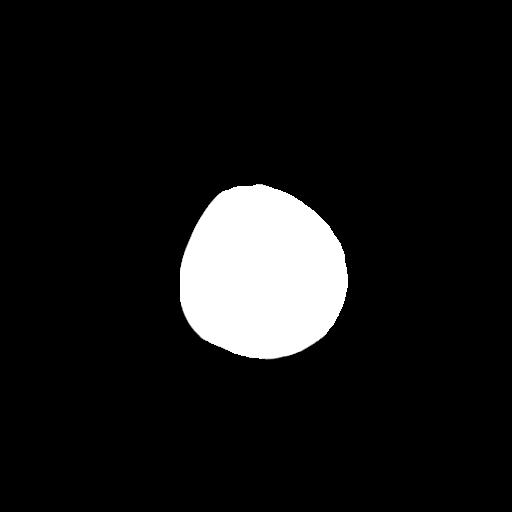
[im 32/36  brain]
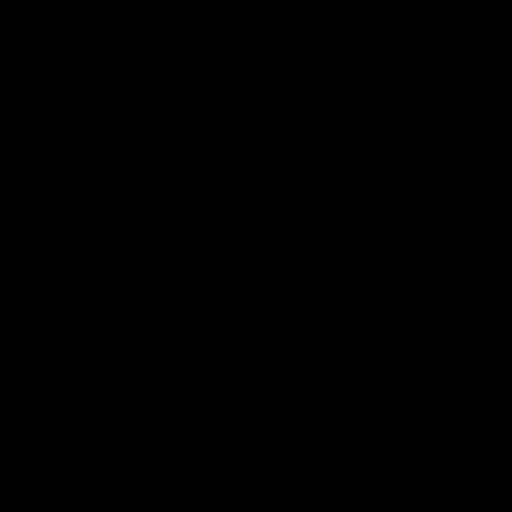
[im 34/36  brain]
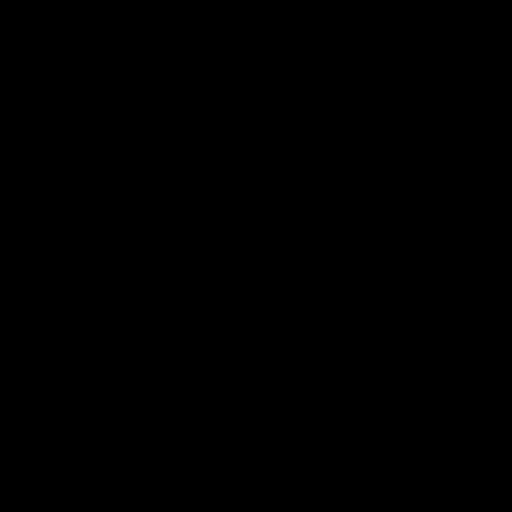

[16 of 30 positions shown; findings below may reference images not displayed]

FINDINGS: Normal ventricular morphology.

No midline shift or mass effect.

Normal appearance of brain parenchyma.

No intracranial hemorrhage, mass lesion or evidence acute
infarction.

No extra-axial fluid collections.

Paranasal sinuses and mastoid air cells clear.

Bones unremarkable.
IMPRESSION: Normal exam.

## 2016-05-21 IMAGING — CR DG CHEST 2V
2 series · 2 of 2 positions shown · non-contrast
Comparison: None.

CLINICAL DATA: Cough, fever for 4 days.

EXAM:
CHEST  2 VIEW

[chest pa]
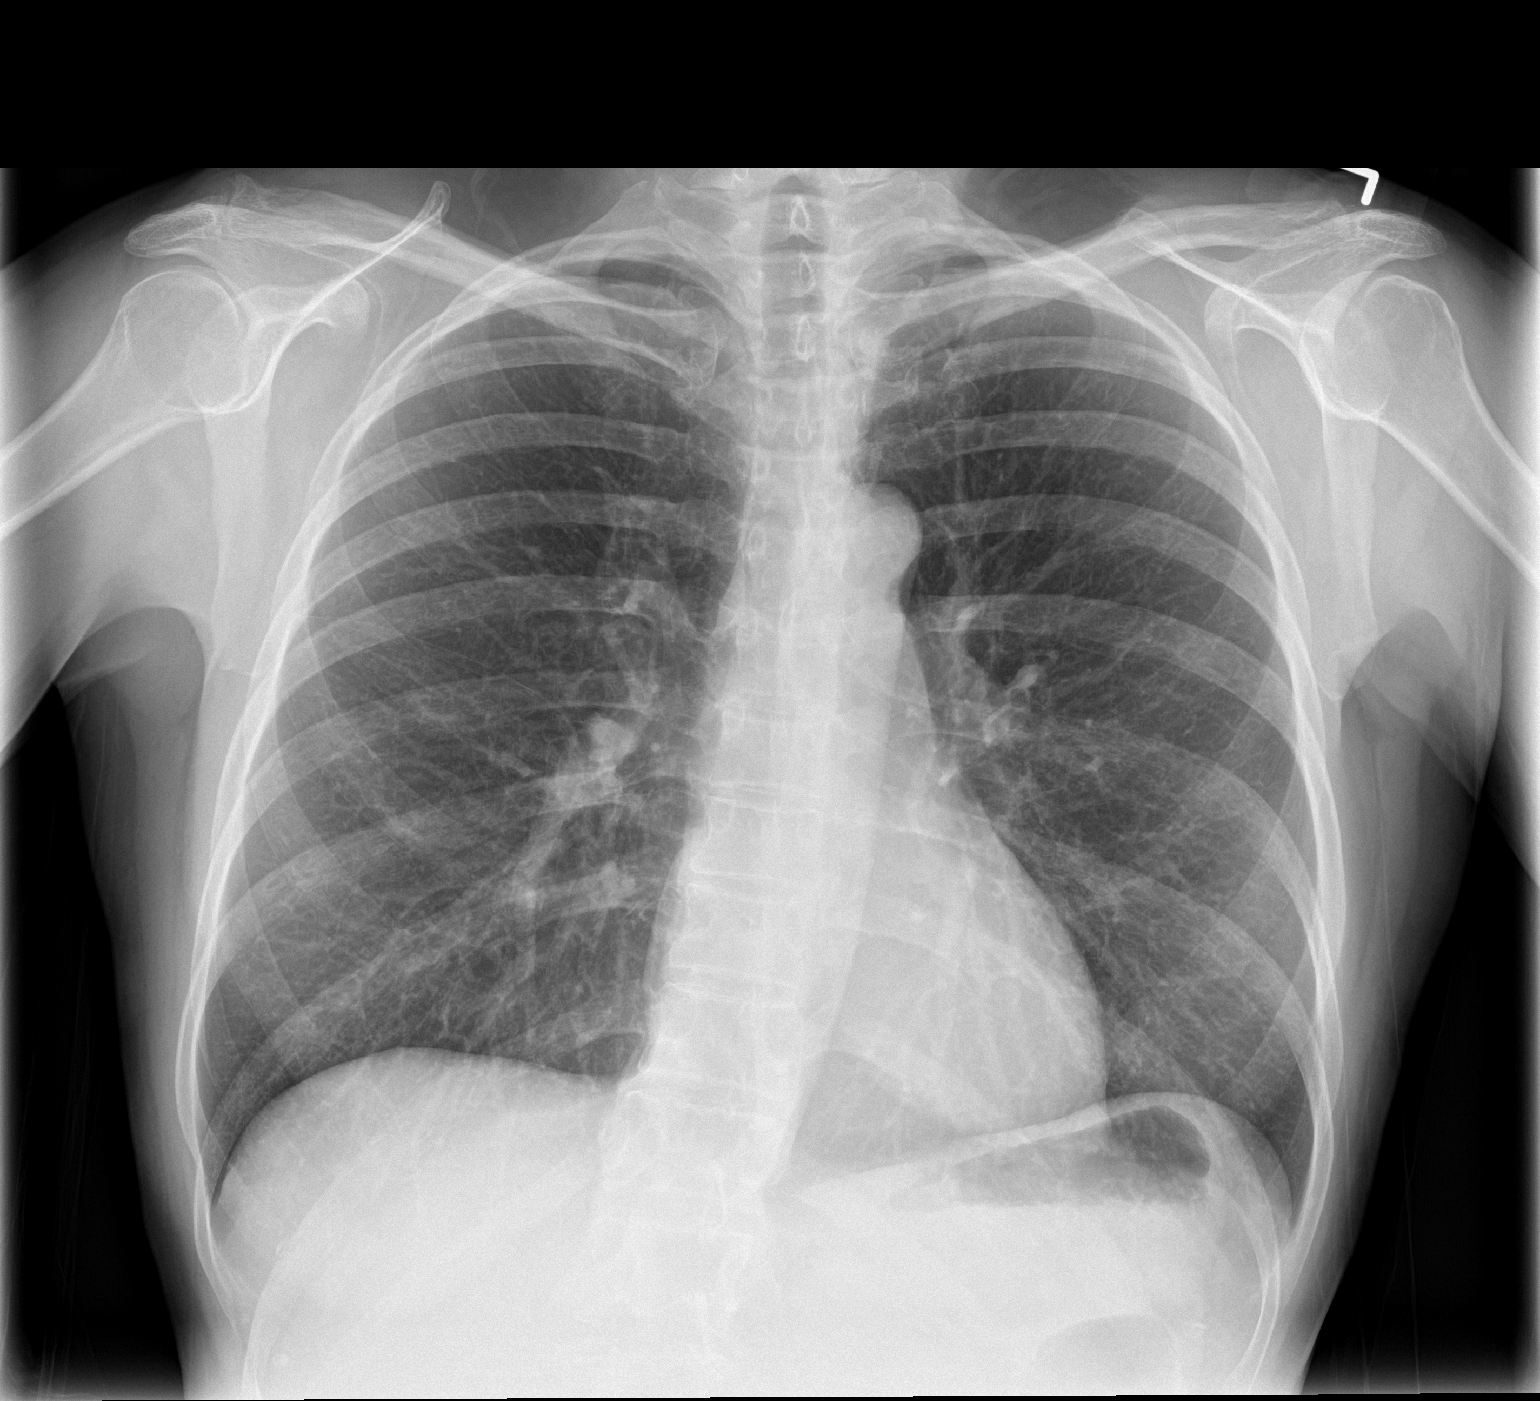

[chest lat]
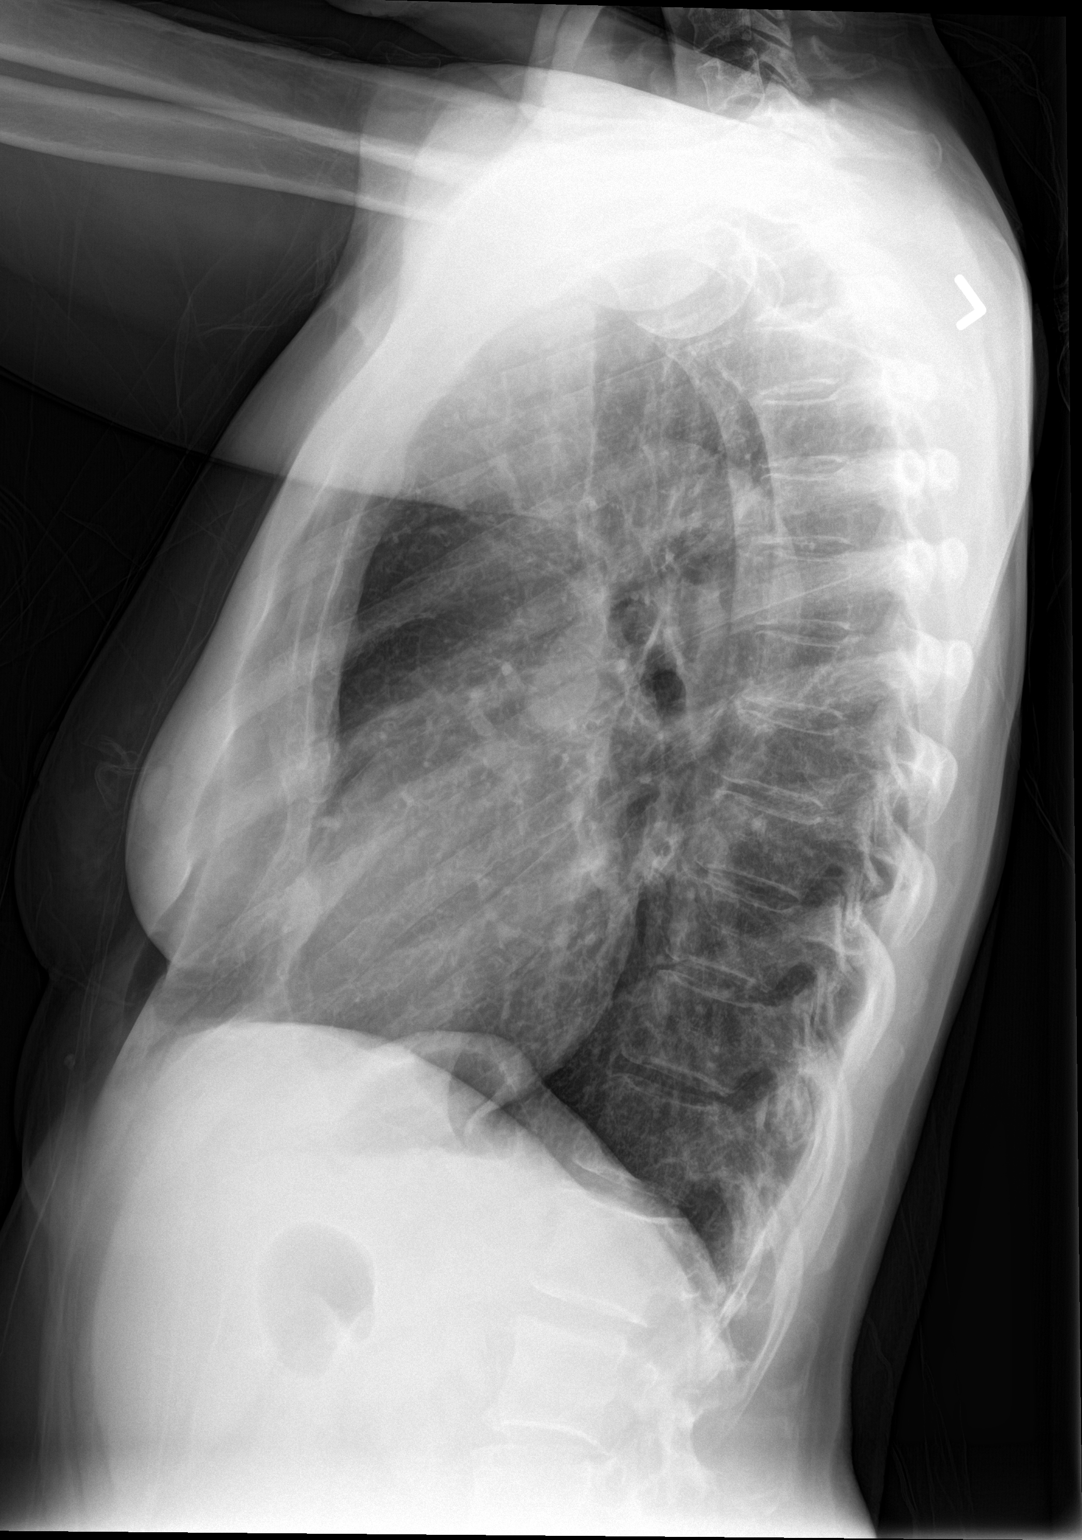

[2 of 2 positions shown; findings below may reference images not displayed]

FINDINGS: The heart size and mediastinal contours are within normal limits.
Both lungs are clear. The visualized skeletal structures are
unremarkable.
IMPRESSION: No active cardiopulmonary disease.
